# Patient Record
Sex: Female | Born: 1950 | Race: White | Hispanic: No | Smoking: Former smoker
Health system: Southern US, Community
[De-identification: ages and names within clinical notes are randomized; demographics above are authoritative.]

## PROBLEM LIST (undated history)

## (undated) DIAGNOSIS — C50919 Malignant neoplasm of unspecified site of unspecified female breast: Secondary | ICD-10-CM

## (undated) DIAGNOSIS — C4491 Basal cell carcinoma of skin, unspecified: Secondary | ICD-10-CM

## (undated) DIAGNOSIS — Z923 Personal history of irradiation: Secondary | ICD-10-CM

## (undated) DIAGNOSIS — Z9221 Personal history of antineoplastic chemotherapy: Secondary | ICD-10-CM

## (undated) DIAGNOSIS — C4492 Squamous cell carcinoma of skin, unspecified: Secondary | ICD-10-CM

## (undated) DIAGNOSIS — G709 Myoneural disorder, unspecified: Secondary | ICD-10-CM

## (undated) DIAGNOSIS — F419 Anxiety disorder, unspecified: Secondary | ICD-10-CM

## (undated) DIAGNOSIS — G4700 Insomnia, unspecified: Secondary | ICD-10-CM

## (undated) DIAGNOSIS — F32A Depression, unspecified: Secondary | ICD-10-CM

## (undated) DIAGNOSIS — F329 Major depressive disorder, single episode, unspecified: Secondary | ICD-10-CM

---

## 1984-01-06 HISTORY — PX: ABDOMINAL HYSTERECTOMY: SHX81

## 1997-01-05 DIAGNOSIS — Z9221 Personal history of antineoplastic chemotherapy: Secondary | ICD-10-CM

## 1997-01-05 DIAGNOSIS — Z923 Personal history of irradiation: Secondary | ICD-10-CM

## 1997-01-05 HISTORY — PX: BREAST LUMPECTOMY: SHX2

## 1997-01-05 HISTORY — DX: Personal history of irradiation: Z92.3

## 1997-01-05 HISTORY — DX: Personal history of antineoplastic chemotherapy: Z92.21

## 1997-02-05 DIAGNOSIS — C50919 Malignant neoplasm of unspecified site of unspecified female breast: Secondary | ICD-10-CM

## 1997-02-05 HISTORY — DX: Malignant neoplasm of unspecified site of unspecified female breast: C50.919

## 1997-04-05 ENCOUNTER — Encounter: Admission: RE | Admit: 1997-04-05 | Discharge: 1997-07-04 | Payer: Self-pay | Admitting: *Deleted

## 1997-06-22 ENCOUNTER — Ambulatory Visit (HOSPITAL_COMMUNITY): Admission: RE | Admit: 1997-06-22 | Discharge: 1997-06-22 | Payer: Self-pay | Admitting: *Deleted

## 1997-07-05 ENCOUNTER — Encounter: Admission: RE | Admit: 1997-07-05 | Discharge: 1997-10-03 | Payer: Self-pay | Admitting: *Deleted

## 1998-02-01 ENCOUNTER — Encounter: Payer: Self-pay | Admitting: Emergency Medicine

## 1998-02-01 ENCOUNTER — Emergency Department (HOSPITAL_COMMUNITY): Admission: EM | Admit: 1998-02-01 | Discharge: 1998-02-01 | Payer: Self-pay | Admitting: Emergency Medicine

## 1998-02-08 ENCOUNTER — Encounter: Admission: RE | Admit: 1998-02-08 | Discharge: 1998-03-04 | Payer: Self-pay | Admitting: Hematology & Oncology

## 1999-01-09 ENCOUNTER — Encounter: Payer: Self-pay | Admitting: Hematology & Oncology

## 1999-01-09 ENCOUNTER — Encounter: Admission: RE | Admit: 1999-01-09 | Discharge: 1999-01-09 | Payer: Self-pay | Admitting: Hematology & Oncology

## 1999-02-13 ENCOUNTER — Ambulatory Visit (HOSPITAL_COMMUNITY): Admission: RE | Admit: 1999-02-13 | Discharge: 1999-02-13 | Payer: Self-pay | Admitting: Hematology & Oncology

## 1999-02-13 ENCOUNTER — Encounter: Payer: Self-pay | Admitting: Hematology & Oncology

## 1999-02-24 ENCOUNTER — Encounter: Payer: Self-pay | Admitting: Hematology & Oncology

## 1999-02-24 ENCOUNTER — Encounter: Admission: RE | Admit: 1999-02-24 | Discharge: 1999-02-24 | Payer: Self-pay | Admitting: Hematology & Oncology

## 1999-04-25 ENCOUNTER — Encounter: Admission: RE | Admit: 1999-04-25 | Discharge: 1999-04-25 | Payer: Self-pay | Admitting: Hematology & Oncology

## 1999-04-25 ENCOUNTER — Encounter: Payer: Self-pay | Admitting: Hematology & Oncology

## 1999-09-30 ENCOUNTER — Encounter: Admission: RE | Admit: 1999-09-30 | Discharge: 1999-09-30 | Payer: Self-pay | Admitting: Obstetrics and Gynecology

## 1999-09-30 ENCOUNTER — Encounter: Payer: Self-pay | Admitting: Obstetrics and Gynecology

## 1999-10-10 ENCOUNTER — Ambulatory Visit (HOSPITAL_BASED_OUTPATIENT_CLINIC_OR_DEPARTMENT_OTHER): Admission: RE | Admit: 1999-10-10 | Discharge: 1999-10-10 | Payer: Self-pay | Admitting: Dentistry

## 2000-10-04 ENCOUNTER — Encounter: Payer: Self-pay | Admitting: Obstetrics and Gynecology

## 2000-10-04 ENCOUNTER — Encounter: Admission: RE | Admit: 2000-10-04 | Discharge: 2000-10-04 | Payer: Self-pay | Admitting: Obstetrics and Gynecology

## 2001-12-19 ENCOUNTER — Encounter: Admission: RE | Admit: 2001-12-19 | Discharge: 2001-12-19 | Payer: Self-pay | Admitting: Obstetrics and Gynecology

## 2001-12-19 ENCOUNTER — Encounter: Payer: Self-pay | Admitting: Obstetrics and Gynecology

## 2002-12-22 ENCOUNTER — Encounter: Admission: RE | Admit: 2002-12-22 | Discharge: 2002-12-22 | Payer: Self-pay | Admitting: Hematology & Oncology

## 2003-01-03 ENCOUNTER — Ambulatory Visit (HOSPITAL_COMMUNITY): Admission: RE | Admit: 2003-01-03 | Discharge: 2003-01-03 | Payer: Self-pay | Admitting: Obstetrics and Gynecology

## 2004-01-02 ENCOUNTER — Encounter: Admission: RE | Admit: 2004-01-02 | Discharge: 2004-01-02 | Payer: Self-pay | Admitting: Obstetrics and Gynecology

## 2004-07-30 ENCOUNTER — Ambulatory Visit: Payer: Self-pay | Admitting: Hematology & Oncology

## 2004-08-05 ENCOUNTER — Encounter: Admission: RE | Admit: 2004-08-05 | Discharge: 2004-08-05 | Payer: Self-pay | Admitting: Hematology & Oncology

## 2005-01-23 ENCOUNTER — Encounter: Admission: RE | Admit: 2005-01-23 | Discharge: 2005-01-23 | Payer: Self-pay | Admitting: Obstetrics and Gynecology

## 2005-02-20 ENCOUNTER — Ambulatory Visit: Payer: Self-pay | Admitting: Hematology & Oncology

## 2005-02-25 ENCOUNTER — Ambulatory Visit (HOSPITAL_COMMUNITY): Admission: RE | Admit: 2005-02-25 | Discharge: 2005-02-25 | Payer: Self-pay | Admitting: Hematology & Oncology

## 2006-02-17 ENCOUNTER — Ambulatory Visit: Payer: Self-pay | Admitting: Hematology & Oncology

## 2006-02-22 LAB — CBC WITH DIFFERENTIAL/PLATELET
BASO%: 0.8 % (ref 0.0–2.0)
Basophils Absolute: 0 10*3/uL (ref 0.0–0.1)
EOS%: 0.4 % (ref 0.0–7.0)
Eosinophils Absolute: 0 10*3/uL (ref 0.0–0.5)
HCT: 45.4 % (ref 34.8–46.6)
HGB: 15.8 g/dL (ref 11.6–15.9)
LYMPH%: 23.7 % (ref 14.0–48.0)
MCH: 32.2 pg (ref 26.0–34.0)
MCHC: 34.8 g/dL (ref 32.0–36.0)
MCV: 92.5 fL (ref 81.0–101.0)
MONO#: 0.4 10*3/uL (ref 0.1–0.9)
MONO%: 6.2 % (ref 0.0–13.0)
NEUT#: 4 10*3/uL (ref 1.5–6.5)
NEUT%: 68.9 % (ref 39.6–76.8)
Platelets: 218 10*3/uL (ref 145–400)
RBC: 4.91 10*6/uL (ref 3.70–5.32)
RDW: 13.1 % (ref 11.3–14.5)
WBC: 5.8 10*3/uL (ref 3.9–10.0)
lymph#: 1.4 10*3/uL (ref 0.9–3.3)

## 2006-02-22 LAB — COMPREHENSIVE METABOLIC PANEL
ALT: 15 U/L (ref 0–35)
AST: 20 U/L (ref 0–37)
Albumin: 4.3 g/dL (ref 3.5–5.2)
Alkaline Phosphatase: 54 U/L (ref 39–117)
BUN: 17 mg/dL (ref 6–23)
CO2: 28 mEq/L (ref 19–32)
Calcium: 9.5 mg/dL (ref 8.4–10.5)
Chloride: 107 mEq/L (ref 96–112)
Creatinine, Ser: 0.8 mg/dL (ref 0.40–1.20)
Glucose, Bld: 119 mg/dL — ABNORMAL HIGH (ref 70–99)
Potassium: 4.3 mEq/L (ref 3.5–5.3)
Sodium: 142 mEq/L (ref 135–145)
Total Bilirubin: 0.5 mg/dL (ref 0.3–1.2)
Total Protein: 6.5 g/dL (ref 6.0–8.3)

## 2006-07-22 ENCOUNTER — Encounter: Admission: RE | Admit: 2006-07-22 | Discharge: 2006-07-22 | Payer: Self-pay | Admitting: Hematology & Oncology

## 2006-10-08 ENCOUNTER — Emergency Department (HOSPITAL_COMMUNITY): Admission: EM | Admit: 2006-10-08 | Discharge: 2006-10-08 | Payer: Self-pay | Admitting: Emergency Medicine

## 2006-10-30 ENCOUNTER — Emergency Department (HOSPITAL_COMMUNITY): Admission: EM | Admit: 2006-10-30 | Discharge: 2006-10-30 | Payer: Self-pay | Admitting: Family Medicine

## 2007-02-17 ENCOUNTER — Ambulatory Visit: Payer: Self-pay | Admitting: Hematology & Oncology

## 2007-02-21 LAB — COMPREHENSIVE METABOLIC PANEL
ALT: 13 U/L (ref 0–35)
AST: 18 U/L (ref 0–37)
Albumin: 4.3 g/dL (ref 3.5–5.2)
Alkaline Phosphatase: 49 U/L (ref 39–117)
BUN: 19 mg/dL (ref 6–23)
CO2: 27 mEq/L (ref 19–32)
Calcium: 9.5 mg/dL (ref 8.4–10.5)
Chloride: 107 mEq/L (ref 96–112)
Creatinine, Ser: 0.76 mg/dL (ref 0.40–1.20)
Glucose, Bld: 88 mg/dL (ref 70–99)
Potassium: 4.5 mEq/L (ref 3.5–5.3)
Sodium: 143 mEq/L (ref 135–145)
Total Bilirubin: 0.4 mg/dL (ref 0.3–1.2)
Total Protein: 6.6 g/dL (ref 6.0–8.3)

## 2007-02-21 LAB — CBC WITH DIFFERENTIAL/PLATELET
BASO%: 0.6 % (ref 0.0–2.0)
Basophils Absolute: 0 10*3/uL (ref 0.0–0.1)
EOS%: 0.7 % (ref 0.0–7.0)
Eosinophils Absolute: 0 10*3/uL (ref 0.0–0.5)
HCT: 42.6 % (ref 34.8–46.6)
HGB: 14.9 g/dL (ref 11.6–15.9)
LYMPH%: 21.1 % (ref 14.0–48.0)
MCH: 32.2 pg (ref 26.0–34.0)
MCHC: 35 g/dL (ref 32.0–36.0)
MCV: 92 fL (ref 81.0–101.0)
MONO#: 0.3 10*3/uL (ref 0.1–0.9)
MONO%: 6.6 % (ref 0.0–13.0)
NEUT#: 3.7 10*3/uL (ref 1.5–6.5)
NEUT%: 71 % (ref 39.6–76.8)
Platelets: 242 10*3/uL (ref 145–400)
RBC: 4.63 10*6/uL (ref 3.70–5.32)
RDW: 13.4 % (ref 11.3–14.5)
WBC: 5.2 10*3/uL (ref 3.9–10.0)
lymph#: 1.1 10*3/uL (ref 0.9–3.3)

## 2007-08-17 ENCOUNTER — Encounter: Admission: RE | Admit: 2007-08-17 | Discharge: 2007-08-17 | Payer: Self-pay | Admitting: Obstetrics and Gynecology

## 2008-10-02 ENCOUNTER — Encounter: Admission: RE | Admit: 2008-10-02 | Discharge: 2008-10-02 | Payer: Self-pay | Admitting: Family Medicine

## 2008-10-11 ENCOUNTER — Encounter: Admission: RE | Admit: 2008-10-11 | Discharge: 2008-10-11 | Payer: Self-pay | Admitting: Family Medicine

## 2009-04-05 ENCOUNTER — Encounter: Admission: RE | Admit: 2009-04-05 | Discharge: 2009-04-05 | Payer: Self-pay | Admitting: Family Medicine

## 2009-11-15 ENCOUNTER — Encounter: Admission: RE | Admit: 2009-11-15 | Discharge: 2009-11-15 | Payer: Self-pay | Admitting: Family Medicine

## 2010-05-05 ENCOUNTER — Emergency Department (HOSPITAL_COMMUNITY)
Admission: EM | Admit: 2010-05-05 | Discharge: 2010-05-06 | Disposition: A | Discharge status: Home / Self Care | Payer: BC Managed Care – PPO | Attending: Emergency Medicine | Admitting: Emergency Medicine

## 2010-05-05 ENCOUNTER — Emergency Department (HOSPITAL_COMMUNITY): Payer: BC Managed Care – PPO

## 2010-05-05 DIAGNOSIS — M25569 Pain in unspecified knee: Secondary | ICD-10-CM | POA: Insufficient documentation

## 2010-05-05 DIAGNOSIS — M2390 Unspecified internal derangement of unspecified knee: Secondary | ICD-10-CM | POA: Insufficient documentation

## 2010-05-05 DIAGNOSIS — G589 Mononeuropathy, unspecified: Secondary | ICD-10-CM | POA: Insufficient documentation

## 2010-05-05 DIAGNOSIS — Z853 Personal history of malignant neoplasm of breast: Secondary | ICD-10-CM | POA: Insufficient documentation

## 2010-05-23 NOTE — Op Note (Signed)
[place]  Patient:    Candace Howe, Candace Howe                     MRN: [id number] Adm. Date:  [id number] Attending:  Juluis Pitch Hincken                           Operative Report  DESCRIPTION OF PROCEDURE:  The patient was brought to the operating room and was placed in the supine position and underwent nasotracheal intubation.  The following local anesthetic was administered; 216 mg Carbocaine with 0.54 mg of Neocobefrin and 36 mg Xylocaine with 0.036 mg epinephrine and 18 mg Marcaine with 0.018 mg epinephrine.  Teeth #4 and 12 were removed with forceps. Sulcular incisions made on the facial and lingual from 10 mm distal to tooth #5 to 10 mm distal to tooth #11.  After removal of granulation tissue, all roots were instrumented with Cavitron and hand instruments.  Heavy subgingival calculus present.  1 to 2 mm osteoplasty was done and all roots were treated with Tetracycline.  Flaps were trimmed and sutured with eight 4-0 chromic gut sutures.  In the mandibular arch, tooth #18 was deemed hopeless due to facial recession and bone loss and was removed along with teeth #20, 21, 28, and 29 with forceps.  The root of tooth #20 was fractured and was removed in conjunction with the flap procedure.  Sulcular incisions were made on the facial and lingual from the distal of tooth #18 to the distal of tooth #22. After removal of granulation tissue, all roots were instrumented with Cavitron and hand instrument.  Moderate bone loss and 1 to 2 mm osteoplasty done.  All roots treated with Tetracycline.  Flaps were trimmed and sutured with four 4-0 chromic gut sutures.  Teeth #27 and 30 were carefully root planed.  Teeth #8, 19, and 30 will need crowns due to decay and fracture line.  No overall complications were encountered and postoperative instruments oral and written were given to the patient and her husband.  She was prescribed Perigaurd rinse and was given a  bottle of this.  Postoperative care will be done in Dr. Elton Sin office. DD:  10/10/99 TD:  10/11/99 Job: [id number]

## 2010-11-11 ENCOUNTER — Other Ambulatory Visit: Payer: Self-pay | Admitting: Family Medicine

## 2010-11-11 DIAGNOSIS — Z1231 Encounter for screening mammogram for malignant neoplasm of breast: Secondary | ICD-10-CM

## 2010-12-04 ENCOUNTER — Ambulatory Visit
Admission: RE | Admit: 2010-12-04 | Discharge: 2010-12-04 | Disposition: A | Discharge status: Home / Self Care | Payer: BC Managed Care – PPO | Source: Ambulatory Visit | Attending: Family Medicine | Admitting: Family Medicine

## 2010-12-04 DIAGNOSIS — Z1231 Encounter for screening mammogram for malignant neoplasm of breast: Secondary | ICD-10-CM

## 2012-02-11 ENCOUNTER — Other Ambulatory Visit: Payer: Self-pay | Admitting: Family Medicine

## 2012-02-11 DIAGNOSIS — Z1231 Encounter for screening mammogram for malignant neoplasm of breast: Secondary | ICD-10-CM

## 2012-02-11 DIAGNOSIS — Z780 Asymptomatic menopausal state: Secondary | ICD-10-CM

## 2012-03-10 ENCOUNTER — Ambulatory Visit
Admission: RE | Admit: 2012-03-10 | Discharge: 2012-03-10 | Disposition: A | Discharge status: Home / Self Care | Payer: BC Managed Care – PPO | Source: Ambulatory Visit | Attending: Family Medicine | Admitting: Family Medicine

## 2012-03-10 DIAGNOSIS — Z1231 Encounter for screening mammogram for malignant neoplasm of breast: Secondary | ICD-10-CM

## 2012-03-14 ENCOUNTER — Other Ambulatory Visit: Payer: Self-pay | Admitting: Family Medicine

## 2012-03-24 ENCOUNTER — Ambulatory Visit
Admission: RE | Admit: 2012-03-24 | Discharge: 2012-03-24 | Disposition: A | Discharge status: Home / Self Care | Payer: BC Managed Care – PPO | Source: Ambulatory Visit | Attending: Family Medicine | Admitting: Family Medicine

## 2012-04-05 ENCOUNTER — Encounter (INDEPENDENT_AMBULATORY_CARE_PROVIDER_SITE_OTHER): Payer: Self-pay | Admitting: General Surgery

## 2012-04-05 ENCOUNTER — Ambulatory Visit (INDEPENDENT_AMBULATORY_CARE_PROVIDER_SITE_OTHER): Payer: BC Managed Care – PPO | Admitting: General Surgery

## 2012-04-05 ENCOUNTER — Encounter (HOSPITAL_COMMUNITY): Payer: Self-pay | Admitting: Pharmacy Technician

## 2012-04-05 VITALS — BP 130/74 | HR 76 | Temp 97.0°F | Resp 18 | Ht 63.0 in | Wt 117.0 lb

## 2012-04-05 DIAGNOSIS — R928 Other abnormal and inconclusive findings on diagnostic imaging of breast: Secondary | ICD-10-CM

## 2012-04-05 DIAGNOSIS — Z853 Personal history of malignant neoplasm of breast: Secondary | ICD-10-CM | POA: Insufficient documentation

## 2012-04-05 DIAGNOSIS — R921 Mammographic calcification found on diagnostic imaging of breast: Secondary | ICD-10-CM

## 2012-04-05 NOTE — Progress Notes (Signed)
Patient ID: Candace Howe, female   DOB: 10/29/1950, 62 y.o.   MRN: [id number]  Chief Complaint  Patient presents with  . New Evaluation    Rt Breast    HPI Candace Howe is a 62 y.o. female.  Referred by Dr Dan Boyle HPI This is a 62-year-old otherwise healthy female who has a history of a left breast cancer treated in 1999 for what appears to be a lumpectomy and axillary lymph node dissection followed by chemotherapy, radiation therapy, and 5 years of tamoxifen. 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 She has otherwise done well since then. She's had no history of abnormal mammograms or any other issues. She has no complaints referable to her right breast now or really her left breast either except for the pre-existing neuropathic pain. She underwent a screening mammogram this year that showed some new microcalcifications in the mid to lower right breast outer portion. She underwent stereotactic core biopsy that was unsuccessful due to the fact there were no calcifications present. Her pathology showed fibrocystic changes. She's referred for evaluation for excisional biopsy. History reviewed. No pertinent past medical history.  Past Surgical History  Procedure Laterality Date  . Abdominal hysterectomy  1986  . Breast lumpectomy  1999    Left Breast    Family History  Problem Relation Age of Onset  . Cancer Father     lung Ca at age 70 yrs    Social History History  Substance Use Topics  . Smoking status: Not on file  . Smokeless tobacco: Not on file  . Alcohol Use: Not on file    Allergies  Allergen Reactions  . Augmentin (Amoxicillin-Pot Clavulanate) Nausea And Vomiting  . Sulfur Rash    Current Outpatient Prescriptions  Medication Sig Dispense Refill  . amitriptyline (ELAVIL) 50 MG tablet       . citalopram (CELEXA) 40 MG tablet       . gabapentin (NEURONTIN) 400 MG capsule       . HYDROcodone-acetaminophen (NORCO/VICODIN)  5-325 MG per tablet       . Vitamin D, Ergocalciferol, (DRISDOL) 50000 UNITS CAPS Take 50,000 Units by mouth.      . LORazepam (ATIVAN) 1 MG tablet        No current facility-administered medications for this visit.    Review of Systems Review of Systems  Constitutional: Negative for fever, chills and unexpected weight change.  HENT: Negative for hearing loss, congestion, sore throat, trouble swallowing and voice change.   Eyes: Negative for visual disturbance.  Respiratory: Negative for cough and wheezing.   Cardiovascular: Negative for chest pain, palpitations and leg swelling.  Gastrointestinal: Negative for nausea, vomiting, abdominal pain, diarrhea, constipation, blood in stool, abdominal distention and anal bleeding.  Genitourinary: Negative for hematuria, vaginal bleeding and difficulty urinating.  Musculoskeletal: Negative for arthralgias.  Skin: Negative for rash and wound.  Neurological: Negative for seizures, syncope and headaches.  Hematological: Negative for adenopathy. Does not bruise/bleed easily.  Psychiatric/Behavioral: Negative for confusion.    Blood pressure 130/74, pulse 76, temperature 97 F (36.1 C), resp. rate 18, height 5' 3" (1.6 m), weight 117 lb (53.071 kg).  Physical Exam Physical Exam  Vitals reviewed. Constitutional: She appears well-developed and well-nourished.  Cardiovascular: Normal rate, regular rhythm and normal heart sounds.   Pulmonary/Chest: Effort normal. She has no wheezes. Right breast exhibits no inverted nipple, no mass, no nipple discharge, no skin change and no tenderness.   Left breast exhibits no inverted nipple, no mass, no nipple discharge, no skin change and no tenderness. Breasts are symmetrical.    Lymphadenopathy:    She has no cervical adenopathy.    Data Reviewed **ADDENDUM** CREATED: 03/25/2012 12:42:10  The patient was called by myself Dr. Boyle. The patient states she  is doing well after her right breast biopsy with  some soreness and  bruising at the biopsy site. The patient was given the results of  her biopsy which was compatible with fibrocystic change, although  there were no microcalcifications within the specimen. Therefore  the patient requires needle localization for excisional biopsy of  these right breast microcalcifications. Patient's immediate  questions and concerns were addressed. The patient was given a  surgical referral to Dr. Zakkary Thibault 04/05/2012 at 09:10 a.m.  **END ADDENDUM** SIGNED BY: Daniel P. Boyle, M.D.   Assessment    Right breast calcifications History breast cancer      Plan    Right breast wire localization biopsy  We discussed indication to rule out cancer with new calcs esp with history. We discussed wire loc biopsy with risks of bleeding/hematoma, infection and possible further surgery if cancer identified.  Will schedule asap.        Candace Howe 04/05/2012, 9:31 AM    

## 2012-04-07 ENCOUNTER — Encounter (HOSPITAL_COMMUNITY)
Admission: RE | Admit: 2012-04-07 | Discharge: 2012-04-07 | Disposition: A | Discharge status: Home / Self Care | Payer: BC Managed Care – PPO | Source: Ambulatory Visit | Attending: General Surgery | Admitting: General Surgery

## 2012-04-07 ENCOUNTER — Encounter (HOSPITAL_COMMUNITY): Payer: Self-pay

## 2012-04-07 DIAGNOSIS — Z01812 Encounter for preprocedural laboratory examination: Secondary | ICD-10-CM | POA: Insufficient documentation

## 2012-04-07 HISTORY — DX: Major depressive disorder, single episode, unspecified: F32.9

## 2012-04-07 HISTORY — DX: Depression, unspecified: F32.A

## 2012-04-07 HISTORY — DX: Myoneural disorder, unspecified: G70.9

## 2012-04-07 LAB — BASIC METABOLIC PANEL
BUN: 11 mg/dL (ref 6–23)
CO2: 30 mEq/L (ref 19–32)
Chloride: 99 mEq/L (ref 96–112)
GFR calc non Af Amer: 76 mL/min — ABNORMAL LOW (ref >90)
Glucose, Bld: 78 mg/dL (ref 70–99)
Potassium: 4.4 mEq/L (ref 3.5–5.1)
Sodium: 135 mEq/L (ref 135–145)

## 2012-04-07 LAB — CBC WITH DIFFERENTIAL/PLATELET
Eosinophils Absolute: 0 10*3/uL (ref 0.0–0.7)
HCT: 43.4 % (ref 36.0–46.0)
Hemoglobin: 15.3 g/dL — ABNORMAL HIGH (ref 12.0–15.0)
Lymphs Abs: 2.5 10*3/uL (ref 0.7–4.0)
MCH: 31.5 pg (ref 26.0–34.0)
Monocytes Absolute: 0.6 10*3/uL (ref 0.1–1.0)
Monocytes Relative: 8 % (ref 3–12)
Neutro Abs: 3.8 10*3/uL (ref 1.7–7.7)
Neutrophils Relative %: 55 % (ref 43–77)
RBC: 4.86 MIL/uL (ref 3.87–5.11)

## 2012-04-07 NOTE — Pre-Procedure Instructions (Signed)
[facility]  04/07/2012   Your procedure is scheduled on:  04-12-2012  Report to [facility] to [facility] when finished at [facility].Take [place] Elevators to the 3rd floor. .  Call this number if you have problems the morning of surgery: [phone]   Remember:   Do not eat food or drink liquids after midnight.   Take these medicines the morning of surgery with A SIP OF WATER: Celexa,pain medication as needed,lorazepam as needed.   Do not wear jewelry, make-up or nail polish.  Do not wear lotions, powders, or perfumes. You may wear deodorant.  Do not shave 48 hours prior to surgery.   Do not bring valuables to the hospital.  Contacts, dentures or bridgework may not be worn into surgery.       Patients discharged the day of surgery will not be allowed to drive home.   Name and phone number of your driver: _______________________   Special Instructions: Shower using CHG 2 nights before surgery and the night before surgery.  If you shower the day of surgery use CHG.  Use special wash - you have one bottle of CHG for all showers.  You should use approximately 1/3 of the bottle for each shower.   Please read over the following fact sheets that you were given: Pain Booklet and Surgical Site Infection Prevention

## 2012-04-11 MED ORDER — CIPROFLOXACIN IN D5W 400 MG/200ML IV SOLN
400.0000 mg | INTRAVENOUS | Status: DC
  Filled 2012-04-11: qty 200

## 2012-04-12 ENCOUNTER — Other Ambulatory Visit (INDEPENDENT_AMBULATORY_CARE_PROVIDER_SITE_OTHER): Payer: Self-pay | Admitting: General Surgery

## 2012-04-12 ENCOUNTER — Encounter (HOSPITAL_COMMUNITY): Payer: Self-pay | Admitting: Certified Registered"

## 2012-04-12 ENCOUNTER — Ambulatory Visit
Admission: RE | Admit: 2012-04-12 | Discharge: 2012-04-12 | Disposition: A | Discharge status: Home / Self Care | Payer: BC Managed Care – PPO | Source: Ambulatory Visit | Attending: General Surgery | Admitting: General Surgery

## 2012-04-12 ENCOUNTER — Ambulatory Visit (HOSPITAL_COMMUNITY): Admission: RE | Admit: 2012-04-12 | Payer: BC Managed Care – PPO | Source: Ambulatory Visit | Admitting: General Surgery

## 2012-04-12 ENCOUNTER — Encounter (HOSPITAL_COMMUNITY): Admission: RE | Payer: Self-pay | Source: Ambulatory Visit

## 2012-04-12 DIAGNOSIS — R921 Mammographic calcification found on diagnostic imaging of breast: Secondary | ICD-10-CM

## 2012-04-12 SURGERY — BREAST BIOPSY WITH NEEDLE LOCALIZATION
Anesthesia: General | Site: Breast | Laterality: Right

## 2012-04-12 MED ORDER — BUPIVACAINE HCL (PF) 0.25 % IJ SOLN
INTRAMUSCULAR | Status: AC
  Filled 2012-04-12: qty 30

## 2012-04-12 NOTE — Preoperative (Signed)
Beta Blockers   Reason not to administer Beta Blockers:Not Applicable 

## 2012-04-14 ENCOUNTER — Telehealth (INDEPENDENT_AMBULATORY_CARE_PROVIDER_SITE_OTHER): Payer: Self-pay

## 2012-04-14 NOTE — Telephone Encounter (Signed)
Called pt to ask her about getting rescheduled for surgery since her bx results come back today. The pt wants to go ahead and get scheduled for surgery ASAP. The pt did ask for me to ask Dr Dwain Sarna about the lymph nodes in the axilla b/c the pt does not want her axilla touched at all. I sent Dr Dwain Sarna a epic message to ask about orders and the axilla. Will find out the answer and call pt back.

## 2012-04-20 ENCOUNTER — Encounter (HOSPITAL_COMMUNITY): Payer: Self-pay

## 2012-04-20 ENCOUNTER — Telehealth (INDEPENDENT_AMBULATORY_CARE_PROVIDER_SITE_OTHER): Payer: Self-pay

## 2012-04-20 NOTE — Telephone Encounter (Signed)
LMOM for pt to call me back.

## 2012-04-23 NOTE — Pre-Procedure Instructions (Signed)
Candace Howe  04/23/2012   Your procedure is scheduled on:  05-02-2012  Monday  Report to [facility] directly from the [facility]  Call this number if you have problems the morning of surgery: [phone]   Remember:   Do not eat food or drink liquids after midnight.    Take these medicines the morning of surgery with A SIP OF WATER: celexa,pain medication as needed,lorazepam as needed   Do not wear jewelry, make-up or nail polish.  Do not wear lotions, powders, or perfumes.   Do not shave 48 hours prior to surgery.   Do not bring valuables to the hospital.  Contacts, dentures or bridgework may not be worn into surgery.       Patients discharged the day of surgery will not be allowed to drive home.   Name and phone number of your driver: _________________   Special Instructions: Shower using CHG 2 nights before surgery and the night before surgery.  If you shower the day of surgery use CHG.  Use special wash - you have one bottle of CHG for all showers.  You should use approximately 1/3 of the bottle for each shower.    Please read over the following fact sheets that you were given: Pain Booklet, Coughing and Deep Breathing and Surgical Site Infection Prevention

## 2012-04-25 ENCOUNTER — Encounter (HOSPITAL_COMMUNITY): Payer: Self-pay

## 2012-04-25 ENCOUNTER — Encounter (INDEPENDENT_AMBULATORY_CARE_PROVIDER_SITE_OTHER): Payer: Self-pay | Admitting: General Surgery

## 2012-04-25 ENCOUNTER — Other Ambulatory Visit (INDEPENDENT_AMBULATORY_CARE_PROVIDER_SITE_OTHER): Payer: Self-pay | Admitting: General Surgery

## 2012-04-25 ENCOUNTER — Encounter (HOSPITAL_COMMUNITY)
Admission: RE | Admit: 2012-04-25 | Discharge: 2012-04-25 | Disposition: A | Discharge status: Home / Self Care | Payer: BC Managed Care – PPO | Source: Ambulatory Visit | Attending: General Surgery | Admitting: General Surgery

## 2012-04-25 DIAGNOSIS — R928 Other abnormal and inconclusive findings on diagnostic imaging of breast: Secondary | ICD-10-CM

## 2012-04-25 HISTORY — DX: Anxiety disorder, unspecified: F41.9

## 2012-04-25 HISTORY — DX: Insomnia, unspecified: G47.00

## 2012-04-25 LAB — CBC
Hemoglobin: 15.5 g/dL — ABNORMAL HIGH (ref 12.0–15.0)
MCH: 31.5 pg (ref 26.0–34.0)
MCHC: 35.1 g/dL (ref 30.0–36.0)
MCV: 89.6 fL (ref 78.0–100.0)
RBC: 4.92 MIL/uL (ref 3.87–5.11)

## 2012-04-25 NOTE — Progress Notes (Signed)
Pt doesn't have a cardiologist  Denies stress test/echo/heart cath  Medical Md is Dr.Maura Hamrick  Denies EKG or CXR in past yr

## 2012-05-01 MED ORDER — VANCOMYCIN HCL IN DEXTROSE 1-5 GM/200ML-% IV SOLN: 1000.0000 mg | INTRAVENOUS | Status: AC

## 2012-05-02 ENCOUNTER — Encounter (HOSPITAL_COMMUNITY): Payer: Self-pay | Admitting: Certified Registered"

## 2012-05-02 ENCOUNTER — Ambulatory Visit (HOSPITAL_COMMUNITY)
Admission: RE | Admit: 2012-05-02 | Discharge: 2012-05-02 | Disposition: A | Discharge status: Home / Self Care | Payer: BC Managed Care – PPO | Source: Ambulatory Visit | Attending: General Surgery | Admitting: General Surgery

## 2012-05-02 ENCOUNTER — Encounter (HOSPITAL_COMMUNITY)
Admission: RE | Disposition: A | Discharge status: Home / Self Care | Payer: Self-pay | Source: Ambulatory Visit | Attending: General Surgery

## 2012-05-02 ENCOUNTER — Ambulatory Visit
Admission: RE | Admit: 2012-05-02 | Discharge: 2012-05-02 | Disposition: A | Discharge status: Home / Self Care | Payer: BC Managed Care – PPO | Source: Ambulatory Visit | Attending: General Surgery | Admitting: General Surgery

## 2012-05-02 ENCOUNTER — Ambulatory Visit (HOSPITAL_COMMUNITY): Payer: BC Managed Care – PPO | Admitting: Certified Registered"

## 2012-05-02 ENCOUNTER — Encounter (HOSPITAL_COMMUNITY): Payer: Self-pay | Admitting: *Deleted

## 2012-05-02 DIAGNOSIS — Z853 Personal history of malignant neoplasm of breast: Secondary | ICD-10-CM | POA: Insufficient documentation

## 2012-05-02 DIAGNOSIS — Z79899 Other long term (current) drug therapy: Secondary | ICD-10-CM | POA: Insufficient documentation

## 2012-05-02 DIAGNOSIS — Z923 Personal history of irradiation: Secondary | ICD-10-CM | POA: Insufficient documentation

## 2012-05-02 DIAGNOSIS — Z882 Allergy status to sulfonamides status: Secondary | ICD-10-CM | POA: Insufficient documentation

## 2012-05-02 DIAGNOSIS — F329 Major depressive disorder, single episode, unspecified: Secondary | ICD-10-CM | POA: Insufficient documentation

## 2012-05-02 DIAGNOSIS — N6089 Other benign mammary dysplasias of unspecified breast: Secondary | ICD-10-CM

## 2012-05-02 DIAGNOSIS — N6019 Diffuse cystic mastopathy of unspecified breast: Secondary | ICD-10-CM | POA: Insufficient documentation

## 2012-05-02 DIAGNOSIS — F411 Generalized anxiety disorder: Secondary | ICD-10-CM | POA: Insufficient documentation

## 2012-05-02 DIAGNOSIS — F3289 Other specified depressive episodes: Secondary | ICD-10-CM | POA: Insufficient documentation

## 2012-05-02 DIAGNOSIS — R928 Other abnormal and inconclusive findings on diagnostic imaging of breast: Secondary | ICD-10-CM

## 2012-05-02 DIAGNOSIS — Z880 Allergy status to penicillin: Secondary | ICD-10-CM | POA: Insufficient documentation

## 2012-05-02 DIAGNOSIS — Z9221 Personal history of antineoplastic chemotherapy: Secondary | ICD-10-CM | POA: Insufficient documentation

## 2012-05-02 DIAGNOSIS — G589 Mononeuropathy, unspecified: Secondary | ICD-10-CM | POA: Insufficient documentation

## 2012-05-02 HISTORY — PX: BREAST BIOPSY: SHX20

## 2012-05-02 LAB — BASIC METABOLIC PANEL
BUN: 20 mg/dL (ref 6–23)
Calcium: 9.5 mg/dL (ref 8.4–10.5)
Creatinine, Ser: 0.77 mg/dL (ref 0.50–1.10)
GFR calc non Af Amer: 89 mL/min — ABNORMAL LOW (ref >90)
Glucose, Bld: 83 mg/dL (ref 70–99)
Potassium: 4.3 mEq/L (ref 3.5–5.1)

## 2012-05-02 LAB — CBC WITH DIFFERENTIAL/PLATELET
Basophils Absolute: 0 10*3/uL (ref 0.0–0.1)
Basophils Relative: 1 % (ref 0–1)
Eosinophils Absolute: 0 10*3/uL (ref 0.0–0.7)
Hemoglobin: 14.8 g/dL (ref 12.0–15.0)
MCH: 30.6 pg (ref 26.0–34.0)
MCHC: 34.9 g/dL (ref 30.0–36.0)
Monocytes Relative: 9 % (ref 3–12)
Neutrophils Relative %: 58 % (ref 43–77)
RDW: 13.5 % (ref 11.5–15.5)

## 2012-05-02 SURGERY — BREAST BIOPSY WITH NEEDLE LOCALIZATION
Anesthesia: General | Site: Breast | Laterality: Right | Wound class: Clean

## 2012-05-02 MED ORDER — ACETAMINOPHEN 10 MG/ML IV SOLN: 1000.0000 mg | Freq: Once | INTRAVENOUS | Status: DC | PRN

## 2012-05-02 MED ORDER — HYDROMORPHONE HCL PF 1 MG/ML IJ SOLN
INTRAMUSCULAR | Status: AC
  Filled 2012-05-02: qty 1

## 2012-05-02 MED ORDER — HYDROMORPHONE HCL PF 1 MG/ML IJ SOLN
0.2500 mg | INTRAMUSCULAR | Status: DC | PRN
  Administered 2012-05-02 (×2): 0.5 mg via INTRAVENOUS

## 2012-05-02 MED ORDER — BUPIVACAINE-EPINEPHRINE PF 0.25-1:200000 % IJ SOLN
INTRAMUSCULAR | Status: AC
  Filled 2012-05-02: qty 30

## 2012-05-02 MED ORDER — FENTANYL CITRATE 0.05 MG/ML IJ SOLN
INTRAMUSCULAR | Status: DC | PRN
  Administered 2012-05-02: 150 ug via INTRAVENOUS

## 2012-05-02 MED ORDER — LACTATED RINGERS IV SOLN
INTRAVENOUS | Status: DC | PRN
  Administered 2012-05-02: 10:00:00 via INTRAVENOUS

## 2012-05-02 MED ORDER — MIDAZOLAM HCL 5 MG/5ML IJ SOLN
INTRAMUSCULAR | Status: DC | PRN
  Administered 2012-05-02: 2 mg via INTRAVENOUS

## 2012-05-02 MED ORDER — PROPOFOL 10 MG/ML IV BOLUS
INTRAVENOUS | Status: DC | PRN
  Administered 2012-05-02: 170 mg via INTRAVENOUS

## 2012-05-02 MED ORDER — CEFAZOLIN SODIUM-DEXTROSE 2-3 GM-% IV SOLR
INTRAVENOUS | Status: AC
  Administered 2012-05-02: 2 g via INTRAVENOUS
  Filled 2012-05-02: qty 50

## 2012-05-02 MED ORDER — LIDOCAINE HCL (CARDIAC) 20 MG/ML IV SOLN
INTRAVENOUS | Status: DC | PRN
  Administered 2012-05-02: 40 mg via INTRAVENOUS

## 2012-05-02 MED ORDER — ONDANSETRON HCL 4 MG/2ML IJ SOLN: 4.0000 mg | Freq: Once | INTRAMUSCULAR | Status: DC | PRN

## 2012-05-02 MED ORDER — HYDROCODONE-ACETAMINOPHEN 5-325 MG PO TABS: 1.0000 | ORAL_TABLET | Freq: Four times a day (QID) | ORAL | Status: DC | PRN

## 2012-05-02 MED ORDER — BUPIVACAINE-EPINEPHRINE 0.25% -1:200000 IJ SOLN
INTRAMUSCULAR | Status: DC | PRN
  Administered 2012-05-02: 16 mL

## 2012-05-02 MED ORDER — 0.9 % SODIUM CHLORIDE (POUR BTL) OPTIME
TOPICAL | Status: DC | PRN
  Administered 2012-05-02: 1000 mL

## 2012-05-02 MED ORDER — ONDANSETRON HCL 4 MG/2ML IJ SOLN
INTRAMUSCULAR | Status: DC | PRN
  Administered 2012-05-02: 4 mg via INTRAVENOUS

## 2012-05-02 SURGICAL SUPPLY — 44 items
APPLIER CLIP 9.375 MED OPEN (MISCELLANEOUS)
BENZOIN TINCTURE PRP APPL 2/3 (GAUZE/BANDAGES/DRESSINGS) ×2 IMPLANT
BINDER BREAST LRG (GAUZE/BANDAGES/DRESSINGS) IMPLANT
BINDER BREAST XLRG (GAUZE/BANDAGES/DRESSINGS) IMPLANT
CANISTER SUCTION 2500CC (MISCELLANEOUS) IMPLANT
CHLORAPREP W/TINT 26ML (MISCELLANEOUS) ×2 IMPLANT
CLIP APPLIE 9.375 MED OPEN (MISCELLANEOUS) IMPLANT
CLOTH BEACON ORANGE TIMEOUT ST (SAFETY) ×2 IMPLANT
COVER SURGICAL LIGHT HANDLE (MISCELLANEOUS) ×2 IMPLANT
DECANTER SPIKE VIAL GLASS SM (MISCELLANEOUS) ×2 IMPLANT
DERMABOND ADVANCED (GAUZE/BANDAGES/DRESSINGS) ×1
DERMABOND ADVANCED .7 DNX12 (GAUZE/BANDAGES/DRESSINGS) ×1 IMPLANT
DEVICE DUBIN SPECIMEN MAMMOGRA (MISCELLANEOUS) ×2 IMPLANT
DRAPE CHEST BREAST 15X10 FENES (DRAPES) ×2 IMPLANT
DRSG TEGADERM 4X4.75 (GAUZE/BANDAGES/DRESSINGS) ×2 IMPLANT
ELECT CAUTERY BLADE 6.4 (BLADE) ×2 IMPLANT
ELECT REM PT RETURN 9FT ADLT (ELECTROSURGICAL) ×2
ELECTRODE REM PT RTRN 9FT ADLT (ELECTROSURGICAL) ×1 IMPLANT
GLOVE BIO SURGEON STRL SZ7 (GLOVE) ×2 IMPLANT
GLOVE BIO SURGEON STRL SZ7.5 (GLOVE) ×2 IMPLANT
GLOVE BIOGEL PI IND STRL 7.0 (GLOVE) ×2 IMPLANT
GLOVE BIOGEL PI IND STRL 7.5 (GLOVE) ×4 IMPLANT
GLOVE BIOGEL PI INDICATOR 7.0 (GLOVE) ×2
GLOVE BIOGEL PI INDICATOR 7.5 (GLOVE) ×4
GLOVE SURG SS PI 7.0 STRL IVOR (GLOVE) ×2 IMPLANT
GOWN STRL NON-REIN LRG LVL3 (GOWN DISPOSABLE) ×6 IMPLANT
KIT BASIN OR (CUSTOM PROCEDURE TRAY) ×2 IMPLANT
KIT MARKER MARGIN INK (KITS) IMPLANT
KIT ROOM TURNOVER OR (KITS) ×2 IMPLANT
NEEDLE HYPO 25GX1X1/2 BEV (NEEDLE) ×2 IMPLANT
NS IRRIG 1000ML POUR BTL (IV SOLUTION) ×2 IMPLANT
PACK GENERAL/GYN (CUSTOM PROCEDURE TRAY) ×2 IMPLANT
PAD ARMBOARD 7.5X6 YLW CONV (MISCELLANEOUS) ×2 IMPLANT
SPONGE GAUZE 4X4 12PLY (GAUZE/BANDAGES/DRESSINGS) ×2 IMPLANT
STAPLER VISISTAT 35W (STAPLE) ×2 IMPLANT
STRIP CLOSURE SKIN 1/2X4 (GAUZE/BANDAGES/DRESSINGS) ×2 IMPLANT
SUT MNCRL AB 4-0 PS2 18 (SUTURE) ×2 IMPLANT
SUT SILK 2 0 SH (SUTURE) ×2 IMPLANT
SUT VIC AB 2-0 SH 27 (SUTURE) ×2
SUT VIC AB 2-0 SH 27XBRD (SUTURE) ×1 IMPLANT
SUT VIC AB 3-0 SH 18 (SUTURE) ×2 IMPLANT
SYR CONTROL 10ML LL (SYRINGE) ×2 IMPLANT
TOWEL OR 17X24 6PK STRL BLUE (TOWEL DISPOSABLE) ×2 IMPLANT
TOWEL OR 17X26 10 PK STRL BLUE (TOWEL DISPOSABLE) ×2 IMPLANT

## 2012-05-02 NOTE — Op Note (Signed)
Preoperative diagnosis history of left breast cancer in 1999, right breast complex sclerosing lesion with abnormal mammogram Postoperative diagnosis: Same as above Procedure: Right breast wire-guided excisional biopsy Surgeon: Dr. Harden Mo Anesthesia: Gen. With LMA Estimated blood loss: Minimal Complications: None Drains: None Pathology: Right breast tissue marked with short stitch superior, long stitch lateral, double stitch deep Sponge and needle count correct at of operation Disposition to recovery in stable condition  Indications: This is a 62 year old female with history of left breast cancer treated in 1999. She underwent screening mammography this year and was found to have a right breast lesion. 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 She was then scheduled for excisional biopsy with wire guidance.  Procedure: The patient first had a wire placed by Dr. Deboraha Sprang at the breast center. I had these mammograms in the operating room. After informed consent was obtained she was then taken to the operating room. She was administered 2 g of cefazolin. Sequential compression devices were placed. She then underwent general anesthesia with LMA. A surgical timeout was then performed.  I made a radial incision overlying the lesion as well as the wire tract. I used the cautery to excise the wire and the surrounding tissue. I then marked this as above. A mammogram was taken confirming removal of the lesion, clip, and wire. This was confirmed by radiology. I then obtained hemostasis. I closed the breast tissue at the 2-0 Vicryl. I closed the dermis with a 3-0 Vicryl and the skin with a 4-0 Monocryl. I infiltrated Marcaine throughout this region. I then placed Steri-Strips and a sterile dressing. She tolerated this well was extubated and transferred to recovery stable.

## 2012-05-02 NOTE — Interval H&P Note (Signed)
History and Physical Interval Note:  05/02/2012 9:29 AM  Candace Howe  has presented today for surgery, with the diagnosis of right breast calcifications  The various methods of treatment have been discussed with the patient and family. After consideration of risks, benefits and other options for treatment, the patient has consented to  Procedure(s): RIGHT BREAST WIRE  LOCALIZATION EXCISION (Right) as a surgical intervention .  The patient's history has been reviewed, patient examined, no change in status, stable for surgery.  I have reviewed the patient's chart and labs.  Questions were answered to the patient's satisfaction.     Allie Gerhold

## 2012-05-02 NOTE — Transfer of Care (Signed)
Immediate Anesthesia Transfer of Care Note  Patient: Candace Howe  Procedure(s) Performed: Procedure(s): RIGHT BREAST WIRE GUIDED BIOPSY (Right)  Patient Location: PACU  Anesthesia Type:General  Level of Consciousness: awake, alert  and oriented  Airway & Oxygen Therapy: Patient Spontanous Breathing and Patient connected to face mask oxygen  Post-op Assessment: Report given to PACU RN and Post -op Vital signs reviewed and stable  Post vital signs: Reviewed  Complications: No apparent anesthesia complications

## 2012-05-02 NOTE — Anesthesia Preprocedure Evaluation (Addendum)
Anesthesia Evaluation  Patient identified by MRN, date of birth, ID band Patient awake    Reviewed: Allergy & Precautions, H&P , NPO status , Patient's Chart, lab work & pertinent test results  History of Anesthesia Complications Negative for: history of anesthetic complications  Airway Mallampati: I TM Distance: >3 FB Neck ROM: Full    Dental  (+) Teeth Intact and Dental Advisory Given   Pulmonary  breath sounds clear to auscultation        Cardiovascular Rhythm:Regular Rate:Normal     Neuro/Psych PSYCHIATRIC DISORDERS Anxiety Depression  Neuromuscular disease (per patient "right sided nerve damage from previous breast surgery, resulting in constant right arm pain")    GI/Hepatic   Endo/Other    Renal/GU      Musculoskeletal   Abdominal   Peds  Hematology   Anesthesia Other Findings   Reproductive/Obstetrics                          Anesthesia Physical Anesthesia Plan  ASA: II  Anesthesia Plan: General   Post-op Pain Management:    Induction: Intravenous  Airway Management Planned: LMA  Additional Equipment:   Intra-op Plan:   Post-operative Plan: Extubation in OR  Informed Consent: I have reviewed the patients History and Physical, chart, labs and discussed the procedure including the risks, benefits and alternatives for the proposed anesthesia with the patient or authorized representative who has indicated his/her understanding and acceptance.   Dental advisory given  Plan Discussed with: Anesthesiologist, Surgeon and CRNA  Anesthesia Plan Comments: (R. Breast mass Anxiety H/O neuropathy following R. Axillary dissection 1999  Plan GA with LMA  Kipp Brood, MD)       Anesthesia Quick Evaluation

## 2012-05-02 NOTE — Addendum Note (Signed)
Addendum created 05/02/12 1403 by Sheppard Evens, CRNA   Modules edited: Anesthesia Medication Administration

## 2012-05-02 NOTE — H&P (View-Only) (Signed)
Patient ID: Candace Howe, female   DOB: [birth year], 62 y.o.   MRN: [id number]  Chief Complaint  Patient presents with  . New Evaluation    Rt Breast    HPI [name] is a 62 y.o. female.  Referred by Dr Lindi Adie HPI This is a 62 year old otherwise healthy female who has a history of a left breast cancer treated in 1999 for what appears to be a lumpectomy and axillary lymph node dissection followed by chemotherapy, radiation therapy, and 5 years of tamoxifen. 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 She has otherwise done well since then. She's had no history of abnormal mammograms or any other issues. She has no complaints referable to her right breast now or really her left breast either except for the pre-existing neuropathic pain. She underwent a screening mammogram this year that showed some new microcalcifications in the mid to lower right breast outer portion. She underwent stereotactic core biopsy that was unsuccessful due to the fact there were no calcifications present. Her pathology showed fibrocystic changes. She's referred for evaluation for excisional biopsy. History reviewed. No pertinent past medical history.  Past Surgical History  Procedure Laterality Date  . Abdominal hysterectomy  1986  . Breast lumpectomy  1999    Left Breast    Family History  Problem Relation Age of Onset  . Cancer Father     lung Ca at age 47 yrs    Social History History  Substance Use Topics  . Smoking status: Not on file  . Smokeless tobacco: Not on file  . Alcohol Use: Not on file    Allergies  Allergen Reactions  . Augmentin (Amoxicillin-Pot Clavulanate) Nausea And Vomiting  . Sulfur Rash    Current Outpatient Prescriptions  Medication Sig Dispense Refill  . amitriptyline (ELAVIL) 50 MG tablet       . citalopram (CELEXA) 40 MG tablet       . gabapentin (NEURONTIN) 400 MG capsule       . HYDROcodone-acetaminophen (NORCO/VICODIN)  5-325 MG per tablet       . Vitamin D, Ergocalciferol, (DRISDOL) 50000 UNITS CAPS Take 50,000 Units by mouth.      Candace Howe LORazepam (ATIVAN) 1 MG tablet        No current facility-administered medications for this visit.    Review of Systems Review of Systems  Constitutional: Negative for fever, chills and unexpected weight change.  HENT: Negative for hearing loss, congestion, sore throat, trouble swallowing and voice change.   Eyes: Negative for visual disturbance.  Respiratory: Negative for cough and wheezing.   Cardiovascular: Negative for chest pain, palpitations and leg swelling.  Gastrointestinal: Negative for nausea, vomiting, abdominal pain, diarrhea, constipation, blood in stool, abdominal distention and anal bleeding.  Genitourinary: Negative for hematuria, vaginal bleeding and difficulty urinating.  Musculoskeletal: Negative for arthralgias.  Skin: Negative for rash and wound.  Neurological: Negative for seizures, syncope and headaches.  Hematological: Negative for adenopathy. Does not bruise/bleed easily.  Psychiatric/Behavioral: Negative for confusion.    Blood pressure 130/74, pulse 76, temperature 97 F (36.1 C), resp. rate 18, height 5\' 3"  (1.6 m), weight 117 lb (53.071 kg).  Physical Exam Physical Exam  Vitals reviewed. Constitutional: She appears well-developed and well-nourished.  Cardiovascular: Normal rate, regular rhythm and normal heart sounds.   Pulmonary/Chest: Effort normal. She has no wheezes. Right breast exhibits no inverted nipple, no mass, no nipple discharge, no skin change and no tenderness.  Left breast exhibits no inverted nipple, no mass, no nipple discharge, no skin change and no tenderness. Breasts are symmetrical.    Lymphadenopathy:    She has no cervical adenopathy.    Data Reviewed **ADDENDUM** CREATED: 03/25/2012 12:42:10  The patient was called by myself Dr. Micheline Maze. The patient states she  is doing well after her right breast biopsy with  some soreness and  bruising at the biopsy site. The patient was given the results of  her biopsy which was compatible with fibrocystic change, although  there were no microcalcifications within the specimen. Therefore  the patient requires needle localization for excisional biopsy of  these right breast microcalcifications. Patient's immediate  questions and concerns were addressed. The patient was given a  surgical referral to Dr. Dwain Sarna 04/05/2012 at 09:10 a.m.  **END ADDENDUM** SIGNED BY: Melton Alar. Micheline Maze, M.D.   Assessment    Right breast calcifications History breast cancer      Plan    Right breast wire localization biopsy  We discussed indication to rule out cancer with new calcs esp with history. We discussed wire loc biopsy with risks of bleeding/hematoma, infection and possible further surgery if cancer identified.  Will schedule asap.        [name] 04/05/2012, 9:31 AM

## 2012-05-02 NOTE — Anesthesia Postprocedure Evaluation (Signed)
  Anesthesia Post-op Note  Patient: Candace Howe  Procedure(s) Performed: Procedure(s): RIGHT BREAST WIRE GUIDED BIOPSY (Right)  Patient Location: PACU  Anesthesia Type:General  Level of Consciousness: awake  Airway and Oxygen Therapy: Patient Spontanous Breathing  Post-op Pain: mild  Post-op Assessment: Post-op Vital signs reviewed  Post-op Vital Signs: stable  Complications: No apparent anesthesia complications

## 2012-05-02 NOTE — Anesthesia Procedure Notes (Signed)
Procedure Name: LMA Insertion Date/Time: 05/02/2012 9:47 AM Performed by: Arlice Colt B Pre-anesthesia Checklist: Patient identified, Emergency Drugs available, Suction available, Patient being monitored and Timeout performed Patient Re-evaluated:Patient Re-evaluated prior to inductionOxygen Delivery Method: Circle system utilized Preoxygenation: Pre-oxygenation with 100% oxygen Intubation Type: IV induction LMA: LMA inserted LMA Size: 4.0 Number of attempts: 1 Placement Confirmation: positive ETCO2 and breath sounds checked- equal and bilateral Tube secured with: Tape Dental Injury: Teeth and Oropharynx as per pre-operative assessment

## 2012-05-03 ENCOUNTER — Encounter (HOSPITAL_COMMUNITY): Payer: Self-pay | Admitting: General Surgery

## 2012-05-03 ENCOUNTER — Encounter (INDEPENDENT_AMBULATORY_CARE_PROVIDER_SITE_OTHER): Payer: BC Managed Care – PPO | Admitting: General Surgery

## 2012-05-04 ENCOUNTER — Telehealth (INDEPENDENT_AMBULATORY_CARE_PROVIDER_SITE_OTHER): Payer: Self-pay

## 2012-05-04 NOTE — Telephone Encounter (Signed)
Called pt to notify her that the path report shows atypical ductal hyperplasia with no malignancy per Dr Dwain Sarna.

## 2012-05-12 ENCOUNTER — Other Ambulatory Visit: Payer: Self-pay | Admitting: Dermatology

## 2012-05-16 ENCOUNTER — Ambulatory Visit (INDEPENDENT_AMBULATORY_CARE_PROVIDER_SITE_OTHER): Payer: BC Managed Care – PPO | Admitting: General Surgery

## 2012-05-16 ENCOUNTER — Telehealth: Payer: Self-pay | Admitting: *Deleted

## 2012-05-16 ENCOUNTER — Encounter (INDEPENDENT_AMBULATORY_CARE_PROVIDER_SITE_OTHER): Payer: Self-pay | Admitting: General Surgery

## 2012-05-16 VITALS — BP 108/74 | HR 94 | Temp 96.7°F | Ht 63.0 in | Wt 115.8 lb

## 2012-05-16 DIAGNOSIS — N6099 Unspecified benign mammary dysplasia of unspecified breast: Secondary | ICD-10-CM

## 2012-05-16 DIAGNOSIS — Z853 Personal history of malignant neoplasm of breast: Secondary | ICD-10-CM

## 2012-05-16 DIAGNOSIS — N6089 Other benign mammary dysplasias of unspecified breast: Secondary | ICD-10-CM

## 2012-05-16 NOTE — Progress Notes (Signed)
Subjective:     Patient ID: Candace Howe, female   DOB: 05-Apr-1950, 62 y.o.   MRN: [id number]  HPI This is a 62 year old otherwise healthy female who has a history of a left breast cancer treated in 1999 for what appears to be a lumpectomy and axillary lymph node dissection followed by chemotherapy, radiation therapy, and 5 years of tamoxifen.  She underwent a screening mammogram this year that showed some new microcalcifications in the mid to lower right breast outer portion. She underwent stereotactic core biopsy that was unsuccessful due to the fact there were no calcifications present. Her pathology showed fibrocystic changes. I then performed a right breast wire-guided excisional biopsy. She's done very well from that and reports no real complaints at all. Her pathology showed atypical ductal hyperplasia with fibrocystic changes. She comes in today to discuss these results.   Review of Systems     Objective:   Physical Exam  Pulmonary/Chest:         Assessment:     History left breast cancer Right breast atypical ductal hyperplasia     Plan:     We discussed her pathology results in detail today. I do think it would be reasonable for her to be seen by medical oncology. We discussed that should be and she would like to go to [facility]. I will set her up to see Dr. Welton Flakes. I think due to her history as well as atypical ductal hyperplasia that consideration of possible antiestrogen and screening with combined mammography an MRI would be reasonable. Also her up to see Dr. Welton Flakes to go over a number of these issues.

## 2012-05-16 NOTE — Telephone Encounter (Signed)
Left message for pt to return my call so I can schedule an appt for her to see Dr. Welton Flakes.

## 2012-05-20 ENCOUNTER — Telehealth: Payer: Self-pay | Admitting: *Deleted

## 2012-05-20 NOTE — Telephone Encounter (Signed)
Confirmed 05/24/12 appt w/ pt.  Unable to mail before appt letter & packet - gave verbal and put in notes for [facility] to give packet at check in.  Emailed Dr. Dwain Sarna to make him aware.  Took paperwork to Med [facility] for chart.

## 2012-05-24 ENCOUNTER — Ambulatory Visit (HOSPITAL_BASED_OUTPATIENT_CLINIC_OR_DEPARTMENT_OTHER): Payer: BC Managed Care – PPO | Admitting: Oncology

## 2012-05-24 ENCOUNTER — Other Ambulatory Visit (HOSPITAL_BASED_OUTPATIENT_CLINIC_OR_DEPARTMENT_OTHER): Payer: BC Managed Care – PPO | Admitting: Lab

## 2012-05-24 ENCOUNTER — Other Ambulatory Visit: Payer: Self-pay | Admitting: *Deleted

## 2012-05-24 ENCOUNTER — Encounter: Payer: Self-pay | Admitting: Oncology

## 2012-05-24 ENCOUNTER — Ambulatory Visit: Payer: BC Managed Care – PPO

## 2012-05-24 VITALS — BP 99/63 | HR 79 | Temp 98.2°F | Resp 20 | Ht 63.0 in | Wt 116.6 lb

## 2012-05-24 DIAGNOSIS — Z853 Personal history of malignant neoplasm of breast: Secondary | ICD-10-CM

## 2012-05-24 DIAGNOSIS — Z85828 Personal history of other malignant neoplasm of skin: Secondary | ICD-10-CM

## 2012-05-24 DIAGNOSIS — N6089 Other benign mammary dysplasias of unspecified breast: Secondary | ICD-10-CM

## 2012-05-24 LAB — CBC WITH DIFFERENTIAL/PLATELET
BASO%: 1 % (ref 0.0–2.0)
EOS%: 0.4 % (ref 0.0–7.0)
HCT: 42.3 % (ref 34.8–46.6)
MCH: 30.9 pg (ref 25.1–34.0)
MCHC: 34 g/dL (ref 31.5–36.0)
NEUT%: 57 % (ref 38.4–76.8)
RBC: 4.65 10*6/uL (ref 3.70–5.45)
RDW: 13.6 % (ref 11.2–14.5)
WBC: 6.3 10*3/uL (ref 3.9–10.3)
lymph#: 2.2 10*3/uL (ref 0.9–3.3)

## 2012-05-24 LAB — COMPREHENSIVE METABOLIC PANEL (CC13)
ALT: 9 U/L (ref 0–55)
AST: 17 U/L (ref 5–34)
Calcium: 9.2 mg/dL (ref 8.4–10.4)
Chloride: 102 mEq/L (ref 98–107)
Creatinine: 0.8 mg/dL (ref 0.6–1.1)
Sodium: 139 mEq/L (ref 136–145)
Total Protein: 6.7 g/dL (ref 6.4–8.3)

## 2012-05-24 MED ORDER — RALOXIFENE HCL 60 MG PO TABS: 60.0000 mg | ORAL_TABLET | Freq: Every day | ORAL | Status: DC

## 2012-05-24 NOTE — Progress Notes (Signed)
Candace Howe [id number] 07-Apr-1950 62 y.o. 05/24/2012 1:18 PM  CC  Ailene Ravel, MD Dr. Sharman Crate Hamrick [address] [place] [address] Dr. Emelia Loron  REASON FOR CONSULTATION:  62 year old female with prior history of left breast cancer 1999. Recently mammogram showed calcifications and lumpectomy revealed atypical ductal hyperplasia. Patient is seen for discussion of future breast cancer risk reduction.   STAGE:  Atypical ductal hyperplasia (right breast)  History of left breast cancer 1999 status post lumpectomy axillary lymph node dissection followed by chemoradiation therapy and 5 years of tamoxifen.  (stage unknown)  REFERRING PHYSICIAN: Dr. Emelia Loron  HISTORY OF PRESENT ILLNESS:  Candace Howe is a 62 y.o. female.  With medical history significant for basal cell carcinomas. She also has a history of left breast cancer originally treated in 1999 by lumpectomy done by Dr. Samuella Cota who has since retired. Her chemotherapy was given by Dr. Jeannine Boga, radiation by Dr. Jackelyn Knife. She then received 5 years of tamoxifen. 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 Patient recently had a screening mammogram performed that revealed new microcalcifications in the mid to lower right breast outer portion.she underwent stereotactic core biopsy that only showed fibrocystic changes.  She therefore was seen by Dr. Emelia Loron and patient underwent an breast biopsy with needle localization on 05/02/2012.the pathology showed atypical ductal hyperplasia with fibrocystic changes.she is now referred to medical oncology for discussion of future breast cancer risk reduction and possibly antiestrogen therapy and screening recommendations. She is without any complaints. She came to the office by herself she was not accompanied by anyone else  Past Medical History: Past Medical History  Diagnosis Date  . Neuromuscular disorder     nerve damage under left  arm  . Cancer     breast cancer  . Depression     takes Celexa daily  . Anxiety     takes Ativan prn  . Insomnia     takes Elavil nightly    Past Surgical History: Past Surgical History  Procedure Laterality Date  . Abdominal hysterectomy  1986  . Breast lumpectomy  1999    Left Breast  . Breast biopsy Right 05/02/2012    Procedure: RIGHT BREAST WIRE GUIDED BIOPSY;  Surgeon: Emelia Loron, MD;  Location: [facility] OR;  Service: General;  Laterality: Right;    Family History: Family History  Problem Relation Age of Onset  . Cancer Father     lung Ca at age 18 yrs    Social History History  Substance Use Topics  . Smoking status: Former Smoker    Quit date: 01/05/1997  . Smokeless tobacco: Not on file  . Alcohol Use: No    Allergies: Allergies  Allergen Reactions  . Augmentin (Amoxicillin-Pot Clavulanate) Nausea And Vomiting  . Sulfa Antibiotics Rash    Current Medications: Current Outpatient Prescriptions  Medication Sig Dispense Refill  . amitriptyline (ELAVIL) 50 MG tablet Take 50 mg by mouth at bedtime.       . citalopram (CELEXA) 40 MG tablet Take 40 mg by mouth daily.       Marland Kitchen gabapentin (NEURONTIN) 400 MG capsule Take 800 mg by mouth 4 (four) times daily.      Marland Kitchen HYDROcodone-acetaminophen (NORCO/VICODIN) 5-325 MG per tablet Take 1-2 tablets by mouth every 6 (six) hours as needed for pain.  20 tablet  1  . LORazepam (ATIVAN) 1 MG tablet Take 1 mg by mouth daily as needed for anxiety.       Marland Kitchen  vitamin D, CHOLECALCIFEROL, 400 UNITS tablet Take 400 Units by mouth 2 (two) times daily.        No current facility-administered medications for this visit.    OB/GYN History:menarche at 14, menopause at early 37's surgical , no HRT, first live birth at 42, 3 live births  Fertility Discussion: n/a Prior History of Cancer: left breast cancer s/p lumpectomy/radaition/chemotherapy/ 5 years tamoxifen in 1999  Health Maintenance:  Colonoscopy yes Bone Density bone  density Last PAP smear 3 years ago  ECOG PERFORMANCE STATUS: 0 - Asymptomatic  Genetic Counseling/testing: no family hx of breat cancer, colon, stomach cancer, uterine cancers, patient with personal hx of breast in 40's  REVIEW OF SYSTEMS:  Patient does have a history of neuropathic type of pains from her prior surgery. Has had multiple skin lesions removed for basal cell carcinoma. She denies any nausea vomiting fevers chills night sweats headaches shortness of breath chest pains palpitations. Patient is very concerned about weight gain if she has to go on tamoxifen. She would prefer not to go on tamoxifen. She has no hematuria hematochezia melena hemoptysis or hematemesis. Remainder of the 14 point review of systems is negative.  PHYSICAL EXAMINATION: Blood pressure 99/63, pulse 79, temperature 98.2 F (36.8 C), temperature source Oral, resp. rate 20, height 5\' 3"  (1.6 m), weight 116 lb 9.6 oz (52.889 kg).  Well-developed nourished female in no acute distress HEENT exam EOMI PERRLA sclerae anicteric no conjunctival pallor oral mucosa is moist neck supple no palpable adenopathy lungs clear cardiovascular regular rate rhythm abdomen soft nontender no HSM Extremities no edema neuro is nonfocal Right breast healing excisional scar there is some tenderness no great news Left breast no masses nipple discharge.   STUDIES/RESULTS: Mm Rt Plc Breast Loc Dev   1st Lesion  Inc Mammo Guide  05/02/2012   *RADIOLOGY REPORT*  Clinical Data:  Atypical ductal hyperplasia in the outer portion of the right breast diagnosed by stereotactic core needle biopsy  RIGHT BREAST NEEDLE LOCALIZATION WITH MAMMOGRAPHIC GUIDANCE AND SPECIMEN RADIOGRAPH  Patient presents for needle localization prior to surgical excision.  The patient and I discussed the procedure of needle localization including benefits and alternatives. We discussed the high likelihood of a successful procedure. We discussed the risks of the procedure,  including infection, bleeding, tissue injury, and further surgery. Written informed consent was given.  Using mammographic guidance, sterile technique, 2% lidocaine, and a 7 cm modified Kopans needle, the clip marking the biopsy site in the outer portion of the right breast was localized using a lateromedial approach.  Films were labeled and sent with the patient to surgery.  She tolerated the procedure well.  Specimen radiograph was performed at [facility] operating room and confirms the clip and wire to be present in the tissue sample.  The specimen is marked for pathology.  Results were discussed with the nurse in the operating room.  IMPRESSION: Needle localization right breast.  No apparent complications.   Original Report Authenticated By: Cain Saupe, M.D.     LABS:    Chemistry      Component Value Date/Time   NA 139 05/24/2012 1223   NA 137 05/02/2012 0900   K 4.3 05/24/2012 1223   K 4.3 05/02/2012 0900   CL 102 05/24/2012 1223   CL 101 05/02/2012 0900   CO2 30* 05/24/2012 1223   CO2 30 05/02/2012 0900   BUN 12.0 05/24/2012 1223   BUN 20 05/02/2012 0900   CREATININE 0.8 05/24/2012 1223  CREATININE 0.77 05/02/2012 0900      Component Value Date/Time   CALCIUM 9.2 05/24/2012 1223   CALCIUM 9.5 05/02/2012 0900   ALKPHOS 66 05/24/2012 1223   ALKPHOS 49 02/21/2007 0839   AST 17 05/24/2012 1223   AST 18 02/21/2007 0839   ALT 9 05/24/2012 1223   ALT 13 02/21/2007 0839   BILITOT 0.35 05/24/2012 1223   BILITOT 0.4 02/21/2007 0839      Lab Results  Component Value Date   WBC 6.3 05/24/2012   HGB 14.4 05/24/2012   HCT 42.3 05/24/2012   MCV 91.0 05/24/2012   PLT 253 05/24/2012   PATHOLOGY: Diagnosis Breast, lumpectomy, Right - ATYPICAL DUCTAL HYPERPLASIA, SEE COMMENT. - FIBROCYSTIC CHANGES. Microscopic Comment The margins are inked and histologically examined. Of note, the area of atypical ductal hyperplasia is greater than 0.2 cm from the nearest margin. Dr. Colonel Bald has seen slide 1B  in consultation with agreement of the findings as they pertain to the above diagnosis. (RAH:caf 05/03/12) Zandra Abts MD Pathologist, Electronic Signature (Case signed 05/03/2012) S ASSESSMENT    62 year old female with  #1 history of left-sided breast cancer requiring lumpectomy and axillary lymph node dissection followed by adjuvant chemotherapy radiation and 5 years of [facility]x [facility].  #2 new diagnosis of atypical ductal hyperplasia of the right breast status post lumpectomy and. No evidence of malignancy was noted. Postoperatively she is doing well. She is seen for discussion of risk reduction for future breast cancers.  #3 patient and I discussed her pathology today and the significance of this. We also in discussed her prior history of cancers. I do think that the patient should be seen by genetic counselor and I have referred her to 1. She also should be on an antiestrogen therapy for future breast risk reduction. She did not want to be on tamoxifen. I recommended use of Evista for prevention. I explained to her the risks and benefits complications and side effects and benefits. She was agreeable to start this.  #4 we discussed screening tools that are available. I calculated her lifetime risk of developing future breast cancer at 34%. I do think she would qualify by NCCN guidelines to get MRIs I have ordered 1. She should also get yearly mammograms.  Clinical Trial Eligibility:no Multidisciplinary conference discussionknown     PLAN:    #1 proceed with a cyst 60 mg daily prescription was sent to her pharmacy.  #2 MRI as a screening tool on a yearly basis.  #3 we discussed lifestyle modification to prevent future cancers.  #4we also discussed not using tanning bed since patient is a tanned her and does have history of basal cell carcinomas.  #5 I have referred her to genetic counseling        Discussion: Patient is being treated per NCCN  breast cancer care guidelines appropriate for stage.n/a   Thank you so much for allowing me to participate in the care of Deshunda C Paino. I will continue to follow up the patient with you and assist in her care.  All questions were answered. The patient knows to call the clinic with any problems, questions or concerns. We can certainly see the patient much sooner if necessary.  I spent 45 minutes counseling the patient face to face. The total time spent in the appointment was 60 minutes.  Drue Second, MD Medical/Oncology [facility] [phone] (beeper) [phone] (Office)  05/24/2012, 1:18 PM

## 2012-05-24 NOTE — Patient Instructions (Addendum)
#1 we discussed your pathology including the present 1 and previous history of breast cancer back in 1999.  #2 we discussed what is atypical ductal hyperplasia. We discussed your risk of developing breast cancer in the future. Your lifetime risk is around 34%.  #3 we discussed prevention strategies including lifestyle modification, screening with MRI and mammograms, we also discussed prevention with medications including use of Evista. You have taken tamoxifen in the past and you have declined tamoxifen due to the side effects.  #4 I have given a prescription for Evista which you will take 60 mg on a daily basis. We discussed side effects rationale and benefits of this.  #5 I have ordered a MRI of the breasts for you.  #6 I will see you back in 3 months time for followup   Raloxifene tablets What is this medicine? RALOXIFENE (ral OX i feen) reduces the amount of calcium lost from bones. It is used to treat and prevent osteoporosis in women who have experienced menopause. This medicine may be used for other purposes; ask your health care provider or pharmacist if you have questions. What should I tell my health care provider before I take this medicine? They need to know if you have any of these conditions: -a history of blood clots -cancer -heart failure -liver disease -premenopausal -an unusual or allergic reaction to raloxifene, other medicines, foods, dyes, or preservatives -pregnant or trying to get pregnant -breast-feeding How should I use this medicine? Take this medicine by mouth with a glass of water. Follow the directions on the prescription label. The tablets can be taken with or without food. Take your doses at regular intervals. Do not take your medicine more often than directed. Talk to your pediatrician regarding the use of this medicine in children. Special care may be needed. Overdosage: If you think you have taken too much of this medicine contact a poison control  center or emergency room at once. NOTE: This medicine is only for you. Do not share this medicine with others. What if I miss a dose? If you miss a dose, take it as soon as you can. If it is almost time for your next dose, take only that dose. Do not take double or extra doses. What may interact with this medicine? -ampicillin -cholestyramine -colestipol -diazepam -diazoxide -female hormones like hormone replacement therapy -lidocaine -warfarin This list may not describe all possible interactions. Give your health care provider a list of all the medicines, herbs, non-prescription drugs, or dietary supplements you use. Also tell them if you smoke, drink alcohol, or use illegal drugs. Some items may interact with your medicine. What should I watch for while using this medicine? Visit your doctor or health care professional for regular checks on your progress. Do not stop taking this medicine except on the advice of your doctor or health care professional. You should make sure you get enough calcium and vitamin D in your diet while you are taking this medicine. Discuss your dietary needs with your health care professional or nutritionist. Exercise may help to prevent bone loss. Discuss your exercise needs with your doctor or health care professional. This medicine can rarely cause blood clots. You should avoid long periods of bed rest while taking this medicine. If you are going to have surgery, tell your doctor or health care professional that you are taking this medicine. This medicine should be stopped at least 3 days before surgery. After surgery, it should be restarted only after you are walking  again. It should not be restarted while you still need long periods of bed rest. You should not smoke while taking this medicine. Smoking may also increase your risk of blood clots. Smoking can also decrease the effects of this medicine. This medicine does not prevent hot flashes. It may cause hot  flashes in some patients at the start of therapy. What side effects may I notice from receiving this medicine? Side effects that you should report to your doctor or health care professional as soon as possible: -change in vision -chest pain -difficulty breathing -leg pain or swelling -skin rash, itching Side effects that usually do not require medical attention (report to your doctor or health care professional if they continue or are bothersome): -fluid build-up -leg cramps -stomach pain -sweating This list may not describe all possible side effects. Call your doctor for medical advice about side effects. You may report side effects to FDA at 1-800-FDA-1088. Where should I keep my medicine? Keep out of the reach of children. Store at room temperature between 15 and 30 degrees C (59 and 86 degrees F). Throw away any unused medicine after the expiration date. NOTE: This sheet is a summary. It may not cover all possible information. If you have questions about this medicine, talk to your doctor, pharmacist, or health care provider.  2013, [employer]vier/Gold Standard. (12/08/2007 3:15:14 PM)

## 2012-05-25 ENCOUNTER — Telehealth: Payer: Self-pay | Admitting: Oncology

## 2012-05-25 NOTE — Telephone Encounter (Signed)
, °

## 2012-06-02 ENCOUNTER — Ambulatory Visit
Admission: RE | Admit: 2012-06-02 | Discharge: 2012-06-02 | Disposition: A | Discharge status: Home / Self Care | Payer: BC Managed Care – PPO | Source: Ambulatory Visit | Attending: Oncology | Admitting: Oncology

## 2012-06-02 ENCOUNTER — Other Ambulatory Visit: Payer: BC Managed Care – PPO

## 2012-06-02 DIAGNOSIS — Z853 Personal history of malignant neoplasm of breast: Secondary | ICD-10-CM

## 2012-06-02 MED ORDER — GADOBENATE DIMEGLUMINE 529 MG/ML IV SOLN
9.0000 mL | Freq: Once | INTRAVENOUS | Status: AC | PRN
  Administered 2012-06-02: 9 mL via INTRAVENOUS

## 2012-06-06 ENCOUNTER — Other Ambulatory Visit: Payer: Self-pay | Admitting: Adult Health

## 2012-06-06 ENCOUNTER — Telehealth: Payer: Self-pay | Admitting: Medical Oncology

## 2012-06-06 DIAGNOSIS — Z853 Personal history of malignant neoplasm of breast: Secondary | ICD-10-CM

## 2012-06-06 NOTE — Telephone Encounter (Signed)
Message copied by Rexene Edison on Mon Jun 06, 2012  4:34 PM ------      Message from: Augustin Schooling C      Created: Mon Jun 06, 2012  8:50 AM       Please call patient.  MRI results shows bilateral pulmonary nodules that need to be further evaluated with a CT of the Chest with contrast.  Please ask if she has tolerated contrast in the past.  Please stress that we have no idea what these nodules are until we eval with CT chest.             Thanks,       L      ----- Message -----         From: Rad Results In Interface         Sent: 06/02/2012   4:43 PM           To: Victorino December, MD                   ------

## 2012-06-06 NOTE — Telephone Encounter (Signed)
Per NP, called patient to inform her of MRI results showing bilateral pulmonary nodules and a CT scan of Chest is needed for further evaluation. Inquired with pt whether she has had problems with contrast dye in the past, patient states she has not. Stressed to pt that per NP, we have no idea what these nodules are until an evaluation with CT chest. Patient states she is concerned. Stressed to patient that is understandable, with a CT chest scan will have a better idea of what the nodules could be. Patient expressed understanding. Sched to call pt for appt.  Mssg sent to NP/MD

## 2012-06-10 ENCOUNTER — Encounter (HOSPITAL_COMMUNITY): Payer: Self-pay

## 2012-06-10 ENCOUNTER — Ambulatory Visit (HOSPITAL_COMMUNITY)
Admission: RE | Admit: 2012-06-10 | Discharge: 2012-06-10 | Disposition: A | Discharge status: Home / Self Care | Payer: BC Managed Care – PPO | Source: Ambulatory Visit | Attending: Adult Health | Admitting: Adult Health

## 2012-06-10 DIAGNOSIS — I2510 Atherosclerotic heart disease of native coronary artery without angina pectoris: Secondary | ICD-10-CM | POA: Insufficient documentation

## 2012-06-10 DIAGNOSIS — Z923 Personal history of irradiation: Secondary | ICD-10-CM | POA: Insufficient documentation

## 2012-06-10 DIAGNOSIS — Z853 Personal history of malignant neoplasm of breast: Secondary | ICD-10-CM

## 2012-06-10 DIAGNOSIS — I700 Atherosclerosis of aorta: Secondary | ICD-10-CM | POA: Insufficient documentation

## 2012-06-10 DIAGNOSIS — R918 Other nonspecific abnormal finding of lung field: Secondary | ICD-10-CM | POA: Insufficient documentation

## 2012-06-10 DIAGNOSIS — Z9221 Personal history of antineoplastic chemotherapy: Secondary | ICD-10-CM | POA: Insufficient documentation

## 2012-06-10 MED ORDER — IOHEXOL 300 MG/ML  SOLN
80.0000 mL | Freq: Once | INTRAMUSCULAR | Status: AC | PRN
  Administered 2012-06-10: 80 mL via INTRAVENOUS

## 2012-07-04 ENCOUNTER — Other Ambulatory Visit: Payer: BC Managed Care – PPO | Admitting: Lab

## 2012-07-04 ENCOUNTER — Encounter: Payer: BC Managed Care – PPO | Admitting: Genetic Counselor

## 2012-07-16 ENCOUNTER — Encounter: Payer: Self-pay | Admitting: *Deleted

## 2012-07-16 NOTE — Progress Notes (Signed)
Mailed after appt letter to pt. 

## 2012-08-18 ENCOUNTER — Telehealth: Payer: Self-pay | Admitting: Oncology

## 2012-08-22 ENCOUNTER — Ambulatory Visit: Payer: BC Managed Care – PPO | Admitting: Oncology

## 2012-08-22 ENCOUNTER — Other Ambulatory Visit: Payer: BC Managed Care – PPO | Admitting: Lab

## 2012-09-06 ENCOUNTER — Ambulatory Visit (HOSPITAL_BASED_OUTPATIENT_CLINIC_OR_DEPARTMENT_OTHER): Payer: BC Managed Care – PPO | Admitting: Oncology

## 2012-09-06 ENCOUNTER — Other Ambulatory Visit (HOSPITAL_BASED_OUTPATIENT_CLINIC_OR_DEPARTMENT_OTHER): Payer: BC Managed Care – PPO

## 2012-09-06 ENCOUNTER — Encounter: Payer: Self-pay | Admitting: Oncology

## 2012-09-06 VITALS — BP 120/76 | HR 66 | Temp 98.8°F | Resp 20 | Ht 63.0 in | Wt 121.9 lb

## 2012-09-06 DIAGNOSIS — N6089 Other benign mammary dysplasias of unspecified breast: Secondary | ICD-10-CM

## 2012-09-06 DIAGNOSIS — Z853 Personal history of malignant neoplasm of breast: Secondary | ICD-10-CM

## 2012-09-06 LAB — COMPREHENSIVE METABOLIC PANEL (CC13)
ALT: 10 U/L (ref 0–55)
AST: 17 U/L (ref 5–34)
Creatinine: 0.7 mg/dL (ref 0.6–1.1)
Total Bilirubin: 0.37 mg/dL (ref 0.20–1.20)

## 2012-09-06 LAB — CBC WITH DIFFERENTIAL/PLATELET
BASO%: 1.1 % (ref 0.0–2.0)
EOS%: 0.5 % (ref 0.0–7.0)
HCT: 40.2 % (ref 34.8–46.6)
LYMPH%: 38 % (ref 14.0–49.7)
MCH: 31 pg (ref 25.1–34.0)
MCHC: 33.5 g/dL (ref 31.5–36.0)
NEUT%: 52.7 % (ref 38.4–76.8)
Platelets: 218 10*3/uL (ref 145–400)
RBC: 4.34 10*6/uL (ref 3.70–5.45)
lymph#: 1.9 10*3/uL (ref 0.9–3.3)

## 2012-09-06 NOTE — Progress Notes (Signed)
OFFICE PROGRESS NOTE  CC**  Candace Ravel, MD Dr. Sharman Crate Hamrick [address] [place] [address] Dr. Emelia Loron  DIAGNOSIS: 62 year old female with prior history of left breast cancer 1999. Recently mammogram showed calcifications and lumpectomy revealed atypical ductal hyperplasia  STAGE:  Atypical ductal hyperplasia (right breast)  History of left breast cancer 1999 status post lumpectomy axillary lymph node dissection followed by chemoradiation therapy and 5 years of tamoxifen. (stage unknown)  PRIOR THERAPY:  #1 She also has a history of left breast cancer originally treated in 1999 by lumpectomy done by Dr. Samuella Cota who has since retired. Her chemotherapy was given by Dr. Jeannine Boga, radiation by Dr. Jackelyn Knife. She then received 5 years of tamoxifen. 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 Patient recently had a screening mammogram performed that revealed new microcalcifications in the mid to lower right breast outer portion.she underwent stereotactic core biopsy that only showed fibrocystic changes  #2 She therefore was seen by Dr. Emelia Loron and patient underwent an breast biopsy with needle localization on 05/02/2012.the pathology showed atypical ductal hyperplasia with fibrocystic changes.she is now referred to medical oncology for discussion of future breast cancer risk reduction and possibly antiestrogen therapy and screening recommendations  #3 Began evista 60 mg daily on 05/24/12    CURRENT THERAPY: evista 60 mg daily  INTERVAL HISTORY: Sonnet Rizor Gotay 62 y.o. female returns for follow up. Clinically patient is doing well, tolerating the evista well. No n/v/d. No headaches or fevers or chills. No nausea or vomiting. Remainder of the 10 point review of systems.  MEDICAL HISTORY: Past Medical History  Diagnosis Date  . Neuromuscular disorder     nerve damage under left arm  . Depression     takes Celexa daily  . Anxiety    takes Ativan prn  . Insomnia     takes Elavil nightly  . breast ca dx'd 02/1997    left    ALLERGIES:  is allergic to augmentin and sulfa antibiotics.  MEDICATIONS:  Current Outpatient Prescriptions  Medication Sig Dispense Refill  . citalopram (CELEXA) 40 MG tablet Take 40 mg by mouth daily.       Marland Kitchen gabapentin (NEURONTIN) 400 MG capsule Take 800 mg by mouth 4 (four) times daily.      Marland Kitchen HYDROcodone-acetaminophen (NORCO/VICODIN) 5-325 MG per tablet Take 1-2 tablets by mouth every 6 (six) hours as needed for pain.  20 tablet  1  . raloxifene (EVISTA) 60 MG tablet Take 1 tablet (60 mg total) by mouth daily.  30 tablet  6  . vitamin D, CHOLECALCIFEROL, 400 UNITS tablet Take 400 Units by mouth 2 (two) times daily.       Marland Kitchen amitriptyline (ELAVIL) 50 MG tablet Take 50 mg by mouth at bedtime.       Marland Kitchen LORazepam (ATIVAN) 1 MG tablet Take 1 mg by mouth daily as needed for anxiety.        No current facility-administered medications for this visit.    SURGICAL HISTORY:  Past Surgical History  Procedure Laterality Date  . Abdominal hysterectomy  1986  . Breast lumpectomy  1999    Left Breast  . Breast biopsy Right 05/02/2012    Procedure: RIGHT BREAST WIRE GUIDED BIOPSY;  Surgeon: Emelia Loron, MD;  Location: [facility] OR;  Service: General;  Laterality: Right;    REVIEW OF SYSTEMS:  Pertinent items are noted in HPI.   HEALTH MAINTENANCE:  PHYSICAL EXAMINATION: Blood pressure 120/76, pulse 66, temperature  98.8 F (37.1 C), temperature source Oral, resp. rate 20, height 5\' 3"  (1.6 m), weight 121 lb 14.4 oz (55.293 kg). Body mass index is 21.6 kg/(m^2). ECOG PERFORMANCE STATUS: 0 - Asymptomatic   General appearance: alert, cooperative and appears stated age Neck: no adenopathy, no carotid bruit, no JVD, supple, symmetrical, trachea midline and thyroid not enlarged, symmetric, no tenderness/mass/nodules Lymph nodes: Cervical, supraclavicular, and axillary nodes normal. Resp: clear to  auscultation bilaterally Back: symmetric, no curvature. ROM normal. No CVA tenderness. Cardio: regular rate and rhythm, S1, S2 normal, no murmur, click, rub or gallop GI: soft, non-tender; bowel sounds normal; no masses,  no organomegaly Extremities: extremities normal, atraumatic, no cyanosis or edema Neurologic: Grossly normal   LABORATORY DATA: Lab Results  Component Value Date   WBC 4.9 09/06/2012   HGB 13.5 09/06/2012   HCT 40.2 09/06/2012   MCV 92.7 09/06/2012   PLT 218 09/06/2012      Chemistry      Component Value Date/Time   NA 142 09/06/2012 1315   NA 137 05/02/2012 0900   K 4.4 09/06/2012 1315   K 4.3 05/02/2012 0900   CL 102 05/24/2012 1223   CL 101 05/02/2012 0900   CO2 29 09/06/2012 1315   CO2 30 05/02/2012 0900   BUN 10.5 09/06/2012 1315   BUN 20 05/02/2012 0900   CREATININE 0.7 09/06/2012 1315   CREATININE 0.77 05/02/2012 0900      Component Value Date/Time   CALCIUM 8.8 09/06/2012 1315   CALCIUM 9.5 05/02/2012 0900   ALKPHOS 47 09/06/2012 1315   ALKPHOS 49 02/21/2007 0839   AST 17 09/06/2012 1315   AST 18 02/21/2007 0839   ALT 10 09/06/2012 1315   ALT 13 02/21/2007 0839   BILITOT 0.37 09/06/2012 1315   BILITOT 0.4 02/21/2007 0839       RADIOGRAPHIC STUDIES:  No results found.  ASSESSMENT: 62 year old female with   #1 history of left-sided breast cancer requiring lumpectomy and axillary lymph node dissection followed by adjuvant chemotherapy radiation and 5 years of Arimidex [facility]   #2 new diagnosis of atypical ductal hyperplasia of the right breast status post lumpectomy and. No evidence of malignancy was noted. Postoperatively she is doing well. She is seen for discussion of risk reduction for future breast cancers.   #3 patient and I discussed her pathology today and the significance of this. We also in discussed her prior history of cancers. I do think that the patient should be seen by genetic counselor and I have referred her to 1. She also should  be on an antiestrogen therapy for future breast risk reduction. She did not want to be on tamoxifen. I recommended use of Evista for prevention. I explained to her the risks and benefits complications and side effects and benefits. She was agreeable to start this.   #4 we discussed screening tools that are available. I calculated her lifetime risk of developing future breast cancer at 34%. I do think she would qualify by NCCN guidelines to get MRIs I have ordered 1. She should also get yearly mammograms.  #5 begun on evista 60 mg daily  PLAN:   #1 continue evista daily  #2 I will see you her back in 6 months -1years   All questions were answered. The patient knows to call the clinic with any problems, questions or concerns. We can certainly see the patient much sooner if necessary.  I spent 20 minutes counseling the patient  face to face. The total time spent in the appointment was 25 minutes.    Drue Second, MD Medical/Oncology [facility] [phone] (beeper) [phone] (Office)  09/06/2012, 2:03 PM

## 2012-09-06 NOTE — Patient Instructions (Addendum)
Doing well, continue the evista daily  We will repeat a CT chest in 6 months  I will see you in 6 months

## 2012-09-08 ENCOUNTER — Telehealth: Payer: Self-pay | Admitting: Oncology

## 2012-09-08 NOTE — Telephone Encounter (Signed)
lmonvm advising the pt of her 6 mnth f/u appt in march 2015. The rad dept will contact the pt with the ct scan appt.

## 2012-09-23 ENCOUNTER — Other Ambulatory Visit: Payer: Self-pay | Admitting: Dermatology

## 2012-11-30 ENCOUNTER — Telehealth: Payer: Self-pay | Admitting: *Deleted

## 2012-11-30 NOTE — Telephone Encounter (Signed)
Lm informed there pt that KK will be in [facility] ON 03/15/13 during the am. gv appt for 03/15/13 @1pm . Made pt aware that i will mail a letter/avs...td

## 2013-01-04 ENCOUNTER — Other Ambulatory Visit: Payer: Self-pay | Admitting: Oncology

## 2013-02-15 ENCOUNTER — Other Ambulatory Visit: Payer: Self-pay

## 2013-02-15 DIAGNOSIS — Z9889 Other specified postprocedural states: Secondary | ICD-10-CM

## 2013-02-15 DIAGNOSIS — Z1231 Encounter for screening mammogram for malignant neoplasm of breast: Secondary | ICD-10-CM

## 2013-03-09 ENCOUNTER — Other Ambulatory Visit (HOSPITAL_BASED_OUTPATIENT_CLINIC_OR_DEPARTMENT_OTHER): Payer: BC Managed Care – PPO

## 2013-03-09 ENCOUNTER — Ambulatory Visit (HOSPITAL_COMMUNITY)
Admission: RE | Admit: 2013-03-09 | Discharge: 2013-03-09 | Disposition: A | Discharge status: Home / Self Care | Payer: BC Managed Care – PPO | Source: Ambulatory Visit | Attending: Oncology | Admitting: Oncology

## 2013-03-09 ENCOUNTER — Encounter (HOSPITAL_COMMUNITY): Payer: Self-pay

## 2013-03-09 DIAGNOSIS — Z853 Personal history of malignant neoplasm of breast: Secondary | ICD-10-CM

## 2013-03-09 DIAGNOSIS — R918 Other nonspecific abnormal finding of lung field: Secondary | ICD-10-CM | POA: Insufficient documentation

## 2013-03-09 DIAGNOSIS — Z923 Personal history of irradiation: Secondary | ICD-10-CM | POA: Insufficient documentation

## 2013-03-09 LAB — CBC WITH DIFFERENTIAL/PLATELET
BASO%: 1 % (ref 0.0–2.0)
BASOS ABS: 0 10*3/uL (ref 0.0–0.1)
EOS ABS: 0.1 10*3/uL (ref 0.0–0.5)
EOS%: 1.9 % (ref 0.0–7.0)
HCT: 44.5 % (ref 34.8–46.6)
HEMOGLOBIN: 14.9 g/dL (ref 11.6–15.9)
LYMPH#: 1.4 10*3/uL (ref 0.9–3.3)
LYMPH%: 34.1 % (ref 14.0–49.7)
MCH: 31.5 pg (ref 25.1–34.0)
MCHC: 33.4 g/dL (ref 31.5–36.0)
MCV: 94.6 fL (ref 79.5–101.0)
MONO#: 0.3 10*3/uL (ref 0.1–0.9)
MONO%: 8.3 % (ref 0.0–14.0)
NEUT%: 54.7 % (ref 38.4–76.8)
NEUTROS ABS: 2.2 10*3/uL (ref 1.5–6.5)
Platelets: 203 10*3/uL (ref 145–400)
RBC: 4.71 10*6/uL (ref 3.70–5.45)
RDW: 14.1 % (ref 11.2–14.5)
WBC: 4 10*3/uL (ref 3.9–10.3)

## 2013-03-09 LAB — COMPREHENSIVE METABOLIC PANEL (CC13)
ALBUMIN: 3.6 g/dL (ref 3.5–5.0)
ALK PHOS: 51 U/L (ref 40–150)
ALT: 12 U/L (ref 0–55)
AST: 21 U/L (ref 5–34)
Anion Gap: 5 mEq/L (ref 3–11)
BUN: 13 mg/dL (ref 7.0–26.0)
CALCIUM: 9.2 mg/dL (ref 8.4–10.4)
CHLORIDE: 109 meq/L (ref 98–109)
CO2: 29 mEq/L (ref 22–29)
Creatinine: 0.7 mg/dL (ref 0.6–1.1)
GLUCOSE: 89 mg/dL (ref 70–140)
POTASSIUM: 5.1 meq/L (ref 3.5–5.1)
SODIUM: 144 meq/L (ref 136–145)
TOTAL PROTEIN: 6.4 g/dL (ref 6.4–8.3)
Total Bilirubin: 0.31 mg/dL (ref 0.20–1.20)

## 2013-03-09 MED ORDER — IOHEXOL 300 MG/ML  SOLN
80.0000 mL | Freq: Once | INTRAMUSCULAR | Status: AC | PRN
  Administered 2013-03-09: 80 mL via INTRAVENOUS

## 2013-03-15 ENCOUNTER — Ambulatory Visit (HOSPITAL_BASED_OUTPATIENT_CLINIC_OR_DEPARTMENT_OTHER): Payer: BC Managed Care – PPO | Admitting: Oncology

## 2013-03-15 ENCOUNTER — Telehealth: Payer: Self-pay | Admitting: *Deleted

## 2013-03-15 ENCOUNTER — Ambulatory Visit: Payer: BC Managed Care – PPO | Admitting: Oncology

## 2013-03-15 ENCOUNTER — Ambulatory Visit
Admission: RE | Admit: 2013-03-15 | Discharge: 2013-03-15 | Disposition: A | Discharge status: Home / Self Care | Payer: BC Managed Care – PPO | Source: Ambulatory Visit

## 2013-03-15 ENCOUNTER — Encounter: Payer: Self-pay | Admitting: Oncology

## 2013-03-15 VITALS — BP 117/75 | HR 88 | Temp 97.7°F | Resp 18 | Ht 63.0 in | Wt 112.8 lb

## 2013-03-15 DIAGNOSIS — R911 Solitary pulmonary nodule: Secondary | ICD-10-CM

## 2013-03-15 DIAGNOSIS — Z1231 Encounter for screening mammogram for malignant neoplasm of breast: Secondary | ICD-10-CM

## 2013-03-15 DIAGNOSIS — N6089 Other benign mammary dysplasias of unspecified breast: Secondary | ICD-10-CM

## 2013-03-15 DIAGNOSIS — Z853 Personal history of malignant neoplasm of breast: Secondary | ICD-10-CM

## 2013-03-15 DIAGNOSIS — Z9889 Other specified postprocedural states: Secondary | ICD-10-CM

## 2013-03-15 NOTE — Patient Instructions (Signed)
Pulmonary Nodule A pulmonary nodule is a small, round growth of tissue in the lung. Pulmonary nodules can range in size from less than 1/5 inch (4 mm) to a little bigger than an inch (25 mm). Most pulmonary nodules are detected when imaging tests of the lung are being performed for a different problem. Pulmonary nodules are usually not cancerous (benign). However, some pulmonary nodules are cancerous (malignant). Follow-up treatment or testing is based on the size of the pulmonary nodule and your risk of getting lung cancer.  CAUSES Benign pulmonary nodules can be caused by various things. Some of the causes include:   Bacterial, fungal, or viral infections. This is usually an old infection that is no longer active, but it can sometimes be a current, active infection.  A benign mass of tissue.  Inflammation from conditions such as rheumatoid arthritis.   Abnormal blood vessels in the lungs. Malignant pulmonary nodules can result from lung cancer or from cancers that spread to the lung from other places in the body. SIGNS AND SYMPTOMS Pulmonary nodules usually do not cause symptoms. DIAGNOSIS Most often, pulmonary nodules are found incidentally when an X-ray or CT scan is performed to look for some other problem in the lung area. To help determine whether a pulmonary nodule is benign or malignant, your health care provider will take a medical history and order a variety of tests. Tests done may include:   Blood tests.  A skin test called a tuberculin test. This test is used to determine if you have been exposed to the germ that causes tuberculosis.   Chest X-rays. If possible, a new X-ray may be compared with X-rays you have had in the past.   CT scan. This test shows smaller pulmonary nodules more clearly than an X-ray.   Positron emission tomography (PET) scan. In this test, a safe amount of a radioactive substance is injected into the bloodstream. Then, the scan takes a picture of  the pulmonary nodule. The radioactive substance is eliminated from your body in your urine.   Biopsy. A tiny piece of the pulmonary nodule is removed so it can be checked under a microscope. TREATMENT  Pulmonary nodules that are benign normally do not require any treatment because they usually do not cause symptoms or breathing problems. Your health care provider may want to monitor the pulmonary nodule through follow-up CT scans. The frequency of these CT scans will vary based on the size of the nodule and the risk factors for lung cancer. For example, CT scans will need to be done more frequently if the pulmonary nodule is larger and if you have a history of smoking and a family history of cancer. Further testing or biopsies may be done if any follow-up CT scan shows that the size of the pulmonary nodule has increased. HOME CARE INSTRUCTIONS  Only take over-the-counter or prescription medicines as directed by your health care provider.  Keep all follow-up appointments with your health care provider. SEEK MEDICAL CARE IF:  You have trouble breathing when you are active.   You feel sick or unusually tired.   You do not feel like eating.   You lose weight without trying to.   You develop chills or night sweats.  SEEK IMMEDIATE MEDICAL CARE IF:  You cannot catch your breath, or you begin wheezing.   You cannot stop coughing.   You cough up blood.   You become dizzy or feel like you are going to pass out.   You  have sudden chest pain.   You have a fever or persistent symptoms for more than 2 3 days.   You have a fever and your symptoms suddenly get worse. MAKE SURE YOU:  Understand these instructions.  Will watch your condition.  Will get help right away if you are not doing well or get worse. Document Released: 10/19/2008 Document Revised: 08/24/2012 Document Reviewed: 06/13/2012 [employer] [facility] Patient Information 2014 [place].  Breast Cancer Survivor  Follow-Up Breast cancer begins when cells in the breast divide too rapidly. The extra cells form a lump (tumor). When the cancer is treated, the goal is to get rid of all cancer cells. However, sometimes a few cells survive. These cancer cells can then grow. They become recurrent cancer. This means the cancer comes back after treatment.  Most cases of recurrent breast cancer develop 3 to 5 years after treatment. However, sometimes it comes back just a few months after treatment. Other times, it does not come back until years later. If the cancer comes back in the same area as the first breast cancer, it is called a local recurrence. If the cancer comes back somewhere else in the body, it is called regional recurrence if the site is fairly near the breast or distant recurrence if it is far from the breast. Your caregiver may also use the term metastasize to indicate a cancer that has gone to another part of your body. Treatment is still possible after either kind of recurrence. The cancer can still be controlled.  CAUSES OF RECURRENT CANCER No one knows exactly why breast cancer starts in the first place. Why the cancer comes back after treatment is also not clear. It is known that certain conditions, called risk factors, can make this more likely. They include:  Developing breast cancer for the first time before age 32.  Having breast cancer that involves the lymph nodes. These are small, round pieces of tissue found all over the body. Their job is to help fight infections.  Having a large tumor. Cancer is more apt to come back if the first tumor was bigger than 2 inches (5 cm).  Having certain types of breast cancer, such as:  Inflammatory breast cancer. This rare type grows rapidly and causes the breast to become red and swollen.  A high-grade tumor. The grade of a tumor indicates how fast it will grow and spread. High-grade tumors grow more quickly than other types.  HER2 cancer. This refers to  the tumor's genetic makeup. Tumors that have this type of gene are more likely to come back after treatment.  Having close tumor margins. This refers to the space between the tumor and normal, noncancerous cells. If the space is small, the tumor has a greater chance of coming back.  Having treatment involving a surgery to remove the tumor but not the entire breast (lumpectomy) and no radiation therapy. CARE AFTER BREAST CANCER Home Monitoring Women who have had breast cancer should continue to examine their breasts every month. The goal is to catch the cancer quickly if it comes back. Many women find it helpful to do so on the same day each month and to mark the calendar as a reminder. Let your caregiver know immediately if you have any signs of recurrent breast cancer. Symptoms will vary, depending on where the cancer recurs. The original type of treatment can also make a difference. Symptoms of local recurrence after a lumpectomy or a recurrence in the opposite breast may include:  A  new lump or thickening in the breast.  A change in the way the skin looks on the breast (such as a rash, dimpling, or wrinkling).  Redness or swelling of the breast.  Changes in the nipple (such as being red, puckered, swollen, or leaking fluid). Symptoms of a recurrence after a breast removal surgery (mastectomy) may include:  A lump or thickening under the skin.  A thickening around the mastectomy scar. Symptoms of regional recurrence in the lymph nodes near the breast may include:  A lump under the arm or above the collarbone.  Swelling of the arm.  Pain in the arm, shoulder, or chest.  Numbness in the hand or arm. Symptoms of distant recurrence may include:  A cough that does not go away.  Trouble breathing or shortness of breath.  Pain in the bones or the chest. This is pain that lasts or does not respond to rest and medicine.  Headaches.  Sudden vision problems.  Dizziness.  Nausea or  vomiting.  Losing weight without trying to.  Persistent abdominal pain.  Changes in bowel movements or blood in the stool.  Yellowing of the skin or eyes (jaundice).  Blood in the urine or bloody vaginal discharge. Clinical Monitoring  It is helpful to keep a schedule of appointments for needed tests and exams. This includes physical exams, breast exams, exams of the lymph nodes, and general exams.  For the first 3 years after being treated for breast cancer, see your caregiver every 3 to 6 months.  For years 4 and 5 after breast cancer, see your caregiver every 6 to 12 months.  After 5 years, see your caregiver at least once a year.  Regular breast X-rays (mammograms) should continue even if you had a mastectomy.  A mammogram should be done 1 year after the mammogram that first detected breast cancer.  A mammogram should be done every 6 to 12 months after that. Follow your caregiver's advice.  A pelvic exam done by your caregiver checks whether female organs are the normal size and shape. The exam is usually done every year. Ask your caregiver if that schedule is right for you.  Women taking tamoxifen should report any vaginal bleeding immediately to their caregiver. Tamoxifen is often given to women with a certain type of breast cancer. It has been shown to help prevent recurrence.  You will need to decide who your primary caregiver will be.  Most people continue to see their cancer specialist (oncologist) every 3 to 6 months for the first year after cancer treatment.  At some point, you may want to go back to seeing your family caregiver. You would no longer see your oncologist for regular checkups. Many women do this about 1 year after their first diagnosis of breast cancer.  You will still need to be seen every so often by your oncologist. Ask how often that should be. Coordinate this with your family or primary caregiver.  Think about having genetic counseling. This would  provide information on traits that can be passed or inherited from one generation to the next. In some cases, breast cancer runs in families. Tell your caregiver if you:  Are of Ashkenazi Jewish heritage.  Have any family member who has had ovarian cancer.  Have a mother, sister, or daughter who had breast cancer before age 99.  Have 2 or more close relatives who have had breast cancer. This means a mother, sister, daughter, aunt, or grandmother.  Had breast cancer in both  breasts.  Have a female relative who has had breast cancer.  Some tests are not recommended for routine screening. Someone recovering from breast cancer does not need to have these tests if there are no problems. The tests have risks, such as radiation exposure, and can be costly. The risks of these tests are thought to be greater than the benefits:  Blood tests.  Chest X-rays.  Bone scans.  Liver ultrasound.  Computed tomography (CT scan).  Positron emission tomography (PET scan).  Magnetic resonance imaging (MRI scan). DIAGNOSIS OF RECURRENT CANCER Recurrent breast cancer may be suspected for various reasons. A mammogram may not look normal. You might feel a lump or have other symptoms. Your caregiver may find something unusual during an exam. To be sure, your caregiver will probably order some tests. The tests are needed because there are symptoms or hints of a problem. They could include:  Blood tests, including a test to check how well the liver is working. The liver is a common site for a distant cancer recurrence.  Imaging tests that create pictures of the inside of the body. These tests include:  Chest X-rays to show if the cancer has come back in the lungs.  CT scans to create detailed pictures of various areas of the body and help find a distant recurrence.  MRI scans to find anything unusual in the breast, chest, or lymph nodes.  Breast ultrasound tests to examine the breasts.  Bone scans to  create a picture of your whole skeleton and find cancer in bony areas.  PET scans to create an image of the whole body. PET scans can be used together with CT scans to show more detail.  Biopsy. A small sample of tissue is taken and checked under a microscope. If cancer cells are found, they may be tested to see if they contain the HER2 gene or the hormones estrogen and progesterone. This will help your caregiver decide how to treat the recurrent cancer. TREATMENT  How recurrent breast cancer is treated depends on where the new cancer is found. The type of treatment that was used for the first breast cancer makes a difference, too. A combination of treatments may be used. Options include:  Surgery.  If the cancer comes back in the breast that was not treated before, you may need a lumpectomy or mastectomy.  If the cancer comes back in the breast that was treated before, you may need a mastectomy.  The lymph nodes under the arm may need to be removed.  Radiation therapy.  For a local recurrence, radiation may be used if it was not used during the first treatment.  For a distance recurrence, radiation is sometimes used.  Chemotherapy.  This may be used before surgery to treat recurrent breast cancer.  This may be used to treat recurrent cancer that cannot be treated with surgery.  This may be used to treat a distant recurrence.  Hormone therapy.  Women with the HER2 gene may be given hormone therapy to attack this gene. Document Released: 08/20/2010 Document Revised: 03/16/2011 Document Reviewed: 08/20/2010 [facility] Patient Information 2014 [place], [place].

## 2013-03-15 NOTE — Telephone Encounter (Signed)
appts made and printed. Pt is aware that cs will call her w/ appt for CT...td

## 2013-03-15 NOTE — Progress Notes (Signed)
OFFICE PROGRESS NOTE  CC**  Leonides Sake, MD Dr. Cristela Blue Hamrick [address] [place] [address] Dr. Rolm Bookbinder  DIAGNOSIS: 63 year old female with prior history of left breast cancer 1999. Recently mammogram showed calcifications and lumpectomy revealed atypical ductal hyperplasia  STAGE:  Atypical ductal hyperplasia (right breast)  History of left breast cancer 1999 status post lumpectomy axillary lymph node dissection followed by chemoradiation therapy and 5 years of tamoxifen. (stage unknown)  PRIOR THERAPY:  #1 patient with or history of left breast cancer originally treated in 1999 by lumpectomy done by Dr. March Rummage who has since retired. Her chemotherapy was given by Dr. Craig Staggers, radiation by Dr. Shelby Dubin. She then received 5 years of tamoxifen. 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 Patient recently had a screening mammogram performed that revealed new microcalcifications in the mid to lower right breast outer portion.she underwent stereotactic core biopsy that only showed fibrocystic changes  #2 She therefore was seen by Dr. Rolm Bookbinder and patient underwent an breast biopsy with needle localization on 05/02/2012.the pathology showed atypical ductal hyperplasia with fibrocystic changes.she is now referred to medical oncology for discussion of future breast cancer risk reduction and possibly antiestrogen therapy and screening recommendations  #3 Began evista 60 mg daily on 05/24/12    CURRENT THERAPY: evista 60 mg daily  INTERVAL HISTORY: Candace Howe 63 y.o. female returns for follow up. Overall patient is doing well. She's tolerating the Evista without any problems. She is concerned about weight loss. Since her last visit she's lost about 8 pounds. She states that she is not eating much. I've encouraged her to eat protein dense and nutrition dense foods to try to gain her weight. She had a CT of the chest performed her pulmonary  nodule is stable. We will get another scan in about a year. Today she denies any headaches double vision blurring of vision fevers chills night sweats. No shortness of breath chest pains palpitations. No abdominal pain no diarrhea or constipation. She has no easy bruising or bleeding. She has no myalgias and arthralgias. No peripheral paresthesias or gait disturbances. Remainder of the 10 point review of systems is negative.   MEDICAL HISTORY: Past Medical History  Diagnosis Date  . Neuromuscular disorder     nerve damage under left arm  . Depression     takes Celexa daily  . Anxiety     takes Ativan prn  . Insomnia     takes Elavil nightly  . breast ca dx'd 02/1997    left    ALLERGIES:  is allergic to augmentin and sulfa antibiotics.  MEDICATIONS:  Current Outpatient Prescriptions  Medication Sig Dispense Refill  . DULoxetine (CYMBALTA) 30 MG capsule       . gabapentin (NEURONTIN) 400 MG capsule Take 800 mg by mouth 4 (four) times daily.      Marland Kitchen HYDROcodone-acetaminophen (NORCO/VICODIN) 5-325 MG per tablet Take 1-2 tablets by mouth every 6 (six) hours as needed for pain.  20 tablet  1  . raloxifene (EVISTA) 60 MG tablet TAKE 1 TABLET BY MOUTH EVERY DAY  30 tablet  3  . amitriptyline (ELAVIL) 50 MG tablet Take 50 mg by mouth at bedtime.       . citalopram (CELEXA) 40 MG tablet Take 40 mg by mouth daily.       Marland Kitchen LORazepam (ATIVAN) 1 MG tablet Take 1 mg by mouth daily as needed for anxiety.       . vitamin  D, CHOLECALCIFEROL, 400 UNITS tablet Take 400 Units by mouth 2 (two) times daily.        No current facility-administered medications for this visit.    SURGICAL HISTORY:  Past Surgical History  Procedure Laterality Date  . Abdominal hysterectomy  1986  . Breast lumpectomy  1999    Left Breast  . Breast biopsy Right 05/02/2012    Procedure: RIGHT BREAST WIRE GUIDED BIOPSY;  Surgeon: Rolm Bookbinder, MD;  Location: [place];  Service: General;  Laterality: Right;    REVIEW OF  SYSTEMS:  Pertinent items are noted in HPI.   HEALTH MAINTENANCE:  PHYSICAL EXAMINATION: Blood pressure 117/75, pulse 88, temperature 97.7 F (36.5 C), temperature source Oral, resp. rate 18, height 5\' 3"  (1.6 m), weight 112 lb 12.8 oz (51.166 kg). Body mass index is 19.99 kg/(m^2). ECOG PERFORMANCE STATUS: 0 - Asymptomatic   General appearance: alert, cooperative and appears stated age Neck: no adenopathy, no carotid bruit, no JVD, supple, symmetrical, trachea midline and thyroid not enlarged, symmetric, no tenderness/mass/nodules Lymph nodes: Cervical, supraclavicular, and axillary nodes normal. Resp: clear to auscultation bilaterally Back: symmetric, no curvature. ROM normal. No CVA tenderness. Cardio: regular rate and rhythm, S1, S2 normal, no murmur, click, rub or gallop GI: soft, non-tender; bowel sounds normal; no masses,  no organomegaly Extremities: extremities normal, atraumatic, no cyanosis or edema Neurologic: Grossly normal   LABORATORY DATA: Lab Results  Component Value Date   WBC 4.0 03/09/2013   HGB 14.9 03/09/2013   HCT 44.5 03/09/2013   MCV 94.6 03/09/2013   PLT 203 03/09/2013      Chemistry      Component Value Date/Time   NA 144 03/09/2013 0916   NA 137 05/02/2012 0900   K 5.1 03/09/2013 0916   K 4.3 05/02/2012 0900   CL 102 05/24/2012 1223   CL 101 05/02/2012 0900   CO2 29 03/09/2013 0916   CO2 30 05/02/2012 0900   BUN 13.0 03/09/2013 0916   BUN 20 05/02/2012 0900   CREATININE 0.7 03/09/2013 0916   CREATININE 0.77 05/02/2012 0900      Component Value Date/Time   CALCIUM 9.2 03/09/2013 0916   CALCIUM 9.5 05/02/2012 0900   ALKPHOS 51 03/09/2013 0916   ALKPHOS 49 02/21/2007 0839   AST 21 03/09/2013 0916   AST 18 02/21/2007 0839   ALT 12 03/09/2013 0916   ALT 13 02/21/2007 0839   BILITOT 0.31 03/09/2013 0916   BILITOT 0.4 02/21/2007 0839       RADIOGRAPHIC STUDIES:  No results found.  ASSESSMENT: 63 year old female with   #1 history of left-sided breast cancer requiring  lumpectomy and axillary lymph node dissection followed by adjuvant chemotherapy radiation and 5 years of Ar[facility]x [facility]   #2 new diagnosis of atypical ductal hyperplasia of the right breast status post lumpectomy and. No evidence of malignancy was noted. Postoperatively she is doing well. She is seen for discussion of risk reduction for future breast cancers.   #3 patient and I discussed her pathology today and the significance of this. We also in discussed her prior history of cancers. I do think that the patient should be seen by genetic counselor and I have referred her to 1. She also should be on an antiestrogen therapy for future breast risk reduction. She did not want to be on tamoxifen. I recommended use of Evista for prevention. I explained to her the risks and benefits complications and side effects and benefits. She  was agreeable to start this.   #4 we discussed screening tools that are available. I calculated her lifetime risk of developing future breast cancer at 34%. I do think she would qualify by NCCN guidelines to get MRIs I have ordered 1. She should also get yearly mammograms.  #5  on evista 60 mg daily  PLAN:   #1 continue evista daily  #2 repeat CT chest without contrast for follow up of pulmonary nodule  #3 I will see  her back in 1years   All questions were answered. The patient knows to call the clinic with any problems, questions or concerns. We can certainly see the patient much sooner if necessary.  I spent 15 minutes counseling the patient face to face. The total time spent in the appointment was 25 minutes.    Marcy Panning, MD Medical/Oncology [facility] [phone] (beeper) [phone] (Office)  03/15/2013, 1:34 PM

## 2013-05-20 ENCOUNTER — Other Ambulatory Visit: Payer: Self-pay | Admitting: Oncology

## 2013-05-20 DIAGNOSIS — Z853 Personal history of malignant neoplasm of breast: Secondary | ICD-10-CM

## 2013-09-07 ENCOUNTER — Other Ambulatory Visit: Payer: Self-pay | Admitting: Dermatology

## 2013-09-12 ENCOUNTER — Other Ambulatory Visit: Payer: Self-pay | Admitting: Dermatology

## 2013-11-24 ENCOUNTER — Other Ambulatory Visit: Payer: Self-pay | Admitting: Dermatology

## 2013-12-19 ENCOUNTER — Telehealth: Payer: Self-pay | Admitting: Hematology and Oncology

## 2013-12-19 NOTE — Telephone Encounter (Signed)
, °

## 2014-02-15 ENCOUNTER — Other Ambulatory Visit: Payer: Self-pay | Admitting: Family Medicine

## 2014-02-15 DIAGNOSIS — M8580 Other specified disorders of bone density and structure, unspecified site: Secondary | ICD-10-CM

## 2014-02-15 DIAGNOSIS — Z1231 Encounter for screening mammogram for malignant neoplasm of breast: Secondary | ICD-10-CM

## 2014-03-07 ENCOUNTER — Other Ambulatory Visit: Payer: Self-pay

## 2014-03-07 DIAGNOSIS — Z853 Personal history of malignant neoplasm of breast: Secondary | ICD-10-CM

## 2014-03-08 ENCOUNTER — Ambulatory Visit (HOSPITAL_COMMUNITY)
Admission: RE | Admit: 2014-03-08 | Discharge: 2014-03-08 | Disposition: A | Discharge status: Home / Self Care | Payer: BC Managed Care – PPO | Source: Ambulatory Visit | Attending: Oncology | Admitting: Oncology

## 2014-03-08 ENCOUNTER — Other Ambulatory Visit (HOSPITAL_BASED_OUTPATIENT_CLINIC_OR_DEPARTMENT_OTHER): Payer: BC Managed Care – PPO

## 2014-03-08 DIAGNOSIS — R911 Solitary pulmonary nodule: Secondary | ICD-10-CM | POA: Insufficient documentation

## 2014-03-08 DIAGNOSIS — Z853 Personal history of malignant neoplasm of breast: Secondary | ICD-10-CM

## 2014-03-08 LAB — COMPREHENSIVE METABOLIC PANEL (CC13)
ALBUMIN: 3.7 g/dL (ref 3.5–5.0)
ALT: 8 U/L (ref 0–55)
ANION GAP: 7 meq/L (ref 3–11)
AST: 20 U/L (ref 5–34)
Alkaline Phosphatase: 60 U/L (ref 40–150)
BUN: 14 mg/dL (ref 7.0–26.0)
CALCIUM: 9.3 mg/dL (ref 8.4–10.4)
CO2: 30 meq/L — AB (ref 22–29)
Chloride: 106 mEq/L (ref 98–109)
Creatinine: 0.8 mg/dL (ref 0.6–1.1)
EGFR: 83 mL/min/{1.73_m2} — ABNORMAL LOW (ref >90)
Glucose: 97 mg/dl (ref 70–140)
POTASSIUM: 4.7 meq/L (ref 3.5–5.1)
SODIUM: 143 meq/L (ref 136–145)
Total Bilirubin: 0.4 mg/dL (ref 0.20–1.20)
Total Protein: 6.3 g/dL — ABNORMAL LOW (ref 6.4–8.3)

## 2014-03-08 LAB — CBC WITH DIFFERENTIAL/PLATELET
BASO%: 1 % (ref 0.0–2.0)
Basophils Absolute: 0 10*3/uL (ref 0.0–0.1)
EOS%: 1 % (ref 0.0–7.0)
Eosinophils Absolute: 0 10*3/uL (ref 0.0–0.5)
HEMATOCRIT: 45.9 % (ref 34.8–46.6)
HGB: 15.1 g/dL (ref 11.6–15.9)
LYMPH#: 1.2 10*3/uL (ref 0.9–3.3)
LYMPH%: 25.9 % (ref 14.0–49.7)
MCH: 31.2 pg (ref 25.1–34.0)
MCHC: 32.8 g/dL (ref 31.5–36.0)
MCV: 94.9 fL (ref 79.5–101.0)
MONO#: 0.4 10*3/uL (ref 0.1–0.9)
MONO%: 7.6 % (ref 0.0–14.0)
NEUT#: 3.1 10*3/uL (ref 1.5–6.5)
NEUT%: 64.5 % (ref 38.4–76.8)
Platelets: 227 10*3/uL (ref 145–400)
RBC: 4.83 10*6/uL (ref 3.70–5.45)
RDW: 13.4 % (ref 11.2–14.5)
WBC: 4.8 10*3/uL (ref 3.9–10.3)

## 2014-03-16 ENCOUNTER — Telehealth: Payer: Self-pay | Admitting: Hematology and Oncology

## 2014-03-16 ENCOUNTER — Ambulatory Visit (HOSPITAL_BASED_OUTPATIENT_CLINIC_OR_DEPARTMENT_OTHER): Payer: BC Managed Care – PPO | Admitting: Hematology and Oncology

## 2014-03-16 VITALS — BP 116/66 | HR 64 | Temp 98.4°F | Resp 18 | Ht 63.0 in | Wt 110.5 lb

## 2014-03-16 DIAGNOSIS — R911 Solitary pulmonary nodule: Secondary | ICD-10-CM

## 2014-03-16 DIAGNOSIS — Z853 Personal history of malignant neoplasm of breast: Secondary | ICD-10-CM

## 2014-03-16 NOTE — Telephone Encounter (Signed)
Confirm appointment for March 2017. Mailed calendar

## 2014-03-16 NOTE — Assessment & Plan Note (Signed)
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 (stage unknown); Her chemotherapy was given by Dr. Craig Staggers, radiation by Dr. Shelby Dubin  Current treatment: Evista 60 mg daily  Breast cancer surveillance: 1. Breast exam 03/16/2014 is normal 2. Mammograms to be done 03/20/2014  Lung nodule follow-up: CT chest on 03/08/2014 revealed stable findings and they're unchanged from 06/10/2012 noncontrast CT scan 1 year is recommended.  Return to clinic in 1 year for follow-up in the survivorship clinic

## 2014-03-16 NOTE — Progress Notes (Signed)
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 (stage unknown); Her chemotherapy was given by Dr. Craig Staggers, radiation by Dr. Shelby Dubin  CHIEF COMPLIANT:  follow-up of history of breast cancer   INTERVAL HISTORY: Candace Howe is a 64 year old lady with above-mentioned history of breast cancer 1999 treated with lumpectomy and radiation adjuvant chemotherapy and hormonal therapy with tamoxifen for 5 years. She had atypical ductal hyperplasia in 2014. She is doing very well from both of these issues. She takes Evista for bone health. She denies any new lumps or nodules in the breasts. She is otherwise feeling healthy.   REVIEW OF SYSTEMS:   Constitutional: Denies fevers, chills or abnormal weight loss Eyes: Denies blurriness of vision Ears, nose, mouth, throat, and face: Denies mucositis or sore throat Respiratory: Denies cough, dyspnea or wheezes Cardiovascular: Denies palpitation, chest discomfort or lower extremity swelling Gastrointestinal:  Denies nausea, heartburn or change in bowel habits Skin: Denies abnormal skin rashes Lymphatics: Denies new lymphadenopathy or easy bruising Neurological:Denies numbness, tingling or new weaknesses Behavioral/Psych: Mood is stable, no new changes  Breast:  denies any pain or lumps or nodules in either breasts All other systems were reviewed with the patient and are negative.  I have reviewed the past medical history, past surgical history, social history and family history with the patient and they are unchanged from previous note.  ALLERGIES:  is allergic to augmentin and sulfa antibiotics.  MEDICATIONS:  Current Outpatient Prescriptions  Medication Sig Dispense Refill  . gabapentin (NEURONTIN) 400  MG capsule Take 800 mg by mouth 4 (four) times daily.    Marland Kitchen LORazepam (ATIVAN) 1 MG tablet Take 1 mg by mouth daily as needed for anxiety.     . raloxifene (EVISTA) 60 MG tablet TAKE 1 TABLET BY MOUTH EVERY DAY 30 tablet 9  . amitriptyline (ELAVIL) 50 MG tablet Take 50 mg by mouth at bedtime.     . citalopram (CELEXA) 40 MG tablet Take 40 mg by mouth daily.     Marland Kitchen HYDROcodone-acetaminophen (NORCO) 7.5-325 MG per tablet     . HYDROcodone-acetaminophen (NORCO/VICODIN) 5-325 MG per tablet Take 1-2 tablets by mouth every 6 (six) hours as needed for pain. (Patient not taking: Reported on 03/16/2014) 20 tablet 1  . vitamin D, CHOLECALCIFEROL, 400 UNITS tablet Take 400 Units by mouth 2 (two) times daily.      No current facility-administered medications for this visit.    PHYSICAL EXAMINATION: ECOG PERFORMANCE STATUS: 0 - Asymptomatic  Filed Vitals:   03/16/14 1033  BP: 116/66  Pulse: 64  Temp: 98.4 F (36.9 C)  Resp: 18   Filed Weights   03/16/14 1033  Weight: 110 lb 8 oz (50.122 kg)    GENERAL:alert, no distress and comfortable SKIN: skin color, texture, turgor are normal, no rashes or significant lesions EYES: normal, Conjunctiva are pink and non-injected, sclera clear OROPHARYNX:no exudate, no erythema and lips, buccal mucosa, and tongue normal  NECK: supple, thyroid normal size, non-tender, without nodularity LYMPH:  no palpable lymphadenopathy in the cervical, axillary or inguinal LUNGS: clear to auscultation and percussion with normal breathing effort HEART: regular rate & rhythm and no murmurs and no lower extremity edema ABDOMEN:abdomen soft, non-tender and normal bowel sounds Musculoskeletal:no cyanosis of digits and no clubbing  NEURO: alert &  oriented x 3 with fluent speech, no focal motor/sensory deficits BREAST: No palpable masses or nodules in either right or left breasts. No palpable axillary supraclavicular or infraclavicular adenopathy no breast tenderness or nipple  discharge. (exam performed in the presence of a chaperone)  LABORATORY DATA:  I have reviewed the data as listed   Chemistry      Component Value Date/Time   NA 143 03/08/2014 0804   NA 137 05/02/2012 0900   K 4.7 03/08/2014 0804   K 4.3 05/02/2012 0900   CL 102 05/24/2012 1223   CL 101 05/02/2012 0900   CO2 30* 03/08/2014 0804   CO2 30 05/02/2012 0900   BUN 14.0 03/08/2014 0804   BUN 20 05/02/2012 0900   CREATININE 0.8 03/08/2014 0804   CREATININE 0.77 05/02/2012 0900      Component Value Date/Time   CALCIUM 9.3 03/08/2014 0804   CALCIUM 9.5 05/02/2012 0900   ALKPHOS 60 03/08/2014 0804   ALKPHOS 49 02/21/2007 0839   AST 20 03/08/2014 0804   AST 18 02/21/2007 0839   ALT 8 03/08/2014 0804   ALT 13 02/21/2007 0839   BILITOT 0.40 03/08/2014 0804   BILITOT 0.4 02/21/2007 0839       Lab Results  Component Value Date   WBC 4.8 03/08/2014   HGB 15.1 03/08/2014   HCT 45.9 03/08/2014   MCV 94.9 03/08/2014   PLT 227 03/08/2014   NEUTROABS 3.1 03/08/2014   ASSESSMENT & PLAN:  History of breast cancer in female 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 (stage unknown); Her chemotherapy was given by Dr. Craig Staggers, radiation by Dr. Shelby Dubin  Current treatment: Evista 60 mg daily  Breast cancer surveillance: 1. Breast exam 03/16/2014 is normal 2. Mammograms to be done 03/20/2014  Lung nodule follow-up: CT chest on 03/08/2014 revealed stable findings and they're unchanged from 06/10/2012 noncontrast CT scan 1 year is recommended.  Return to clinic in 1 year for follow-up   No orders of the defined types were placed in this encounter.   The patient has a good understanding of the overall plan. she agrees with it. She will call with any problems that may develop before her next visit here.   Rulon Eisenmenger, MD

## 2014-03-20 ENCOUNTER — Ambulatory Visit
Admission: RE | Admit: 2014-03-20 | Discharge: 2014-03-20 | Disposition: A | Discharge status: Home / Self Care | Payer: BC Managed Care – PPO | Source: Ambulatory Visit | Attending: Family Medicine | Admitting: Family Medicine

## 2014-03-20 DIAGNOSIS — M8580 Other specified disorders of bone density and structure, unspecified site: Secondary | ICD-10-CM

## 2014-03-20 DIAGNOSIS — Z1231 Encounter for screening mammogram for malignant neoplasm of breast: Secondary | ICD-10-CM

## 2014-10-21 ENCOUNTER — Telehealth: Payer: Self-pay | Admitting: Hematology and Oncology

## 2014-10-21 NOTE — Telephone Encounter (Signed)
lvm for pt regaring to 3.10 appt moved to 3.13 due to md onpal....mailed pt appt sched and letter

## 2015-03-15 ENCOUNTER — Other Ambulatory Visit: Payer: BC Managed Care – PPO

## 2015-03-15 ENCOUNTER — Ambulatory Visit: Payer: BC Managed Care – PPO | Admitting: Hematology and Oncology

## 2015-03-16 NOTE — Assessment & Plan Note (Signed)
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 (stage unknown); Her chemotherapy was given by Dr. Craig Staggers, radiation by Dr. Shelby Dubin  Current treatment: Evista 60 mg daily  Breast cancer surveillance: 1. Breast exam 03/16/2014 is normal 2. Mammograms to be done 03/20/2014  Lung nodule follow-up: CT chest on 03/08/2014 revealed stable findings and they're unchanged from 06/10/2012 noncontrast CT scan 1 year is recommended.  Return to clinic in 1 year for follow-up

## 2015-03-18 ENCOUNTER — Other Ambulatory Visit: Payer: Self-pay | Admitting: *Deleted

## 2015-03-18 ENCOUNTER — Other Ambulatory Visit (HOSPITAL_BASED_OUTPATIENT_CLINIC_OR_DEPARTMENT_OTHER): Payer: BC Managed Care – PPO

## 2015-03-18 ENCOUNTER — Ambulatory Visit (HOSPITAL_BASED_OUTPATIENT_CLINIC_OR_DEPARTMENT_OTHER): Payer: BC Managed Care – PPO | Admitting: Hematology and Oncology

## 2015-03-18 ENCOUNTER — Other Ambulatory Visit: Payer: Self-pay | Admitting: Hematology and Oncology

## 2015-03-18 ENCOUNTER — Encounter: Payer: Self-pay | Admitting: Hematology and Oncology

## 2015-03-18 VITALS — BP 94/56 | HR 82 | Temp 97.8°F | Resp 18 | Ht 63.0 in | Wt 109.1 lb

## 2015-03-18 DIAGNOSIS — Z853 Personal history of malignant neoplasm of breast: Secondary | ICD-10-CM

## 2015-03-18 DIAGNOSIS — R918 Other nonspecific abnormal finding of lung field: Secondary | ICD-10-CM | POA: Insufficient documentation

## 2015-03-18 DIAGNOSIS — R2231 Localized swelling, mass and lump, right upper limb: Secondary | ICD-10-CM

## 2015-03-18 LAB — CBC WITH DIFFERENTIAL/PLATELET
BASO%: 0.7 % (ref 0.0–2.0)
Basophils Absolute: 0 10*3/uL (ref 0.0–0.1)
EOS ABS: 0 10*3/uL (ref 0.0–0.5)
EOS%: 0.6 % (ref 0.0–7.0)
HCT: 48.2 % — ABNORMAL HIGH (ref 34.8–46.6)
HEMOGLOBIN: 15.7 g/dL (ref 11.6–15.9)
LYMPH%: 34 % (ref 14.0–49.7)
MCH: 30.5 pg (ref 25.1–34.0)
MCHC: 32.6 g/dL (ref 31.5–36.0)
MCV: 93.3 fL (ref 79.5–101.0)
MONO#: 0.3 10*3/uL (ref 0.1–0.9)
MONO%: 6.4 % (ref 0.0–14.0)
NEUT#: 2.8 10*3/uL (ref 1.5–6.5)
NEUT%: 58.3 % (ref 38.4–76.8)
Platelets: 223 10*3/uL (ref 145–400)
RBC: 5.17 10*6/uL (ref 3.70–5.45)
RDW: 14 % (ref 11.2–14.5)
WBC: 4.9 10*3/uL (ref 3.9–10.3)
lymph#: 1.7 10*3/uL (ref 0.9–3.3)

## 2015-03-18 LAB — COMPREHENSIVE METABOLIC PANEL
ALT: 10 U/L (ref 0–55)
AST: 16 U/L (ref 5–34)
Albumin: 3.5 g/dL (ref 3.5–5.0)
Alkaline Phosphatase: 53 U/L (ref 40–150)
Anion Gap: 7 mEq/L (ref 3–11)
BUN: 19.3 mg/dL (ref 7.0–26.0)
CHLORIDE: 105 meq/L (ref 98–109)
CO2: 31 mEq/L — ABNORMAL HIGH (ref 22–29)
Calcium: 8.9 mg/dL (ref 8.4–10.4)
Creatinine: 0.8 mg/dL (ref 0.6–1.1)
EGFR: 77 mL/min/{1.73_m2} — AB (ref >90)
GLUCOSE: 108 mg/dL (ref 70–140)
POTASSIUM: 4.3 meq/L (ref 3.5–5.1)
SODIUM: 143 meq/L (ref 136–145)
Total Bilirubin: 0.37 mg/dL (ref 0.20–1.20)
Total Protein: 6.5 g/dL (ref 6.4–8.3)

## 2015-03-18 NOTE — Progress Notes (Signed)
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 (stage unknown); Her chemotherapy was given by Dr. Burney Gauze, radiation by Dr. Shelby Dubin  CHIEF COMPLIANT: follow-up of history of breast cancer   INTERVAL HISTORY: Candace Howe is a 65 year old with above-mentioned history of left breast cancer 1999 underwent lumpectomy with axillary lymph node dissection followed by chemotherapy and radiation in 5 years of tamoxifen therapy. 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 She was diagnosed with atypical ductal hyperplasia in 2014 and she is currently taking Evista for risk reduction.  REVIEW OF SYSTEMS:   Constitutional: Denies fevers, chills or abnormal weight loss Eyes: Denies blurriness of vision Ears, nose, mouth, throat, and face: Denies mucositis or sore throat Respiratory: Cough for the past 2 months Cardiovascular: Denies palpitation, chest discomfort Gastrointestinal:  Denies nausea, heartburn or change in bowel habits Skin: Denies abnormal skin rashes Lymphatics: Denies new lymphadenopathy or easy bruising Neurological:Denies numbness, tingling or new weaknesses Behavioral/Psych: Mood is stable, no new changes  Extremities: No lower extremity edema Breast: Left axillary discomfort  All other systems were reviewed with the patient and are negative.  I have reviewed the past medical history, past surgical history, social history and family history with the patient and they are unchanged from previous note.  ALLERGIES:  is allergic to augmentin and sulfa antibiotics.  MEDICATIONS:  Current Outpatient Prescriptions  Medication Sig Dispense Refill  . amitriptyline (ELAVIL) 50 MG tablet Take 50 mg by mouth at  bedtime.     . citalopram (CELEXA) 40 MG tablet Take 40 mg by mouth daily.     Marland Kitchen gabapentin (NEURONTIN) 400 MG capsule Take 800 mg by mouth 4 (four) times daily.    Marland Kitchen HYDROcodone-acetaminophen (NORCO) 7.5-325 MG per tablet     . HYDROcodone-acetaminophen (NORCO/VICODIN) 5-325 MG per tablet Take 1-2 tablets by mouth every 6 (six) hours as needed for pain. (Patient not taking: Reported on 03/16/2014) 20 tablet 1  . LORazepam (ATIVAN) 1 MG tablet Take 1 mg by mouth daily as needed for anxiety.     . raloxifene (EVISTA) 60 MG tablet TAKE 1 TABLET BY MOUTH EVERY DAY 30 tablet 9  . vitamin D, CHOLECALCIFEROL, 400 UNITS tablet Take 400 Units by mouth 2 (two) times daily.      No current facility-administered medications for this visit.    PHYSICAL EXAMINATION: ECOG PERFORMANCE STATUS: 1 - Symptomatic but completely ambulatory  Filed Vitals:   03/18/15 1011  BP: 94/56  Pulse: 82  Temp: 97.8 F (36.6 C)  Resp: 18   Filed Weights   03/18/15 1011  Weight: 109 lb 1.6 oz (49.487 kg)    GENERAL:alert, no distress and comfortable SKIN: skin color, texture, turgor are normal, no rashes or significant lesions EYES: normal, Conjunctiva are pink and non-injected, sclera clear OROPHARYNX:no exudate, no erythema and lips, buccal mucosa, and tongue normal  NECK: supple, thyroid normal size, non-tender, without nodularity LYMPH:  no palpable lymphadenopathy in the cervical, axillary or inguinal LUNGS: clear to auscultation and percussion with normal breathing effort HEART: regular rate & rhythm and no murmurs and no lower extremity edema ABDOMEN:abdomen soft, non-tender and normal bowel sounds MUSCULOSKELETAL:no cyanosis of digits and no clubbing  NEURO: alert & oriented x 3 with fluent speech, no  focal motor/sensory deficits EXTREMITIES: No lower extremity edema BREAST:No No palpable masses or nodules in either right or left breasts. No palpable axillary supraclavicular or infraclavicular adenopathy  no breast tenderness or nipple discharge. (exam performed in the presence of a chaperone)  LABORATORY DATA:  I have reviewed the data as listed   Chemistry      Component Value Date/Time   NA 143 03/08/2014 0804   NA 137 05/02/2012 0900   K 4.7 03/08/2014 0804   K 4.3 05/02/2012 0900   CL 102 05/24/2012 1223   CL 101 05/02/2012 0900   CO2 30* 03/08/2014 0804   CO2 30 05/02/2012 0900   BUN 14.0 03/08/2014 0804   BUN 20 05/02/2012 0900   CREATININE 0.8 03/08/2014 0804   CREATININE 0.77 05/02/2012 0900      Component Value Date/Time   CALCIUM 9.3 03/08/2014 0804   CALCIUM 9.5 05/02/2012 0900   ALKPHOS 60 03/08/2014 0804   ALKPHOS 49 02/21/2007 0839   AST 20 03/08/2014 0804   AST 18 02/21/2007 0839   ALT 8 03/08/2014 0804   ALT 13 02/21/2007 0839   BILITOT 0.40 03/08/2014 0804   BILITOT 0.4 02/21/2007 0839       Lab Results  Component Value Date   WBC 4.9 03/18/2015   HGB 15.7 03/18/2015   HCT 48.2* 03/18/2015   MCV 93.3 03/18/2015   PLT 223 03/18/2015   NEUTROABS 2.8 03/18/2015   ASSESSMENT & PLAN:  History of breast cancer in female 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 (stage unknown); Her chemotherapy was given by Dr. Burney Gauze, radiation by Dr. Shelby Dubin  Current treatment: Evista 60 mg daily  Breast cancer surveillance: 1. Breast exam 03/18/2014: The small palpable lymph node right axillary area and a small palpable nodule over the left scapula 2. Mammograms to be done next week. I will order ultrasound of the axilla to evaluate for lymph nodes.  Lung nodule follow-up: CT chest on 03/08/2014 revealed stable findings and they're unchanged from 06/10/2012 noncontrast CT scan will be ordered. If this scan is normal or unchanged, she will not need any further scans. Patient will call me after  the scan so that we can review the results of the scan on the phone.  Patient wishes to follow-up with her primary care physician for her breast cancer surveillance. I will see her on an as-needed basis.  No orders of the defined types were placed in this encounter.   The patient has a good understanding of the overall plan. she agrees with it. she will call with any problems that may develop before the next visit here.   Rulon Eisenmenger, MD 03/18/2015

## 2015-03-26 ENCOUNTER — Ambulatory Visit
Admission: RE | Admit: 2015-03-26 | Discharge: 2015-03-26 | Disposition: A | Discharge status: Home / Self Care | Payer: BC Managed Care – PPO | Source: Ambulatory Visit | Attending: Hematology and Oncology | Admitting: Hematology and Oncology

## 2015-03-26 DIAGNOSIS — R2231 Localized swelling, mass and lump, right upper limb: Secondary | ICD-10-CM

## 2015-03-26 DIAGNOSIS — Z853 Personal history of malignant neoplasm of breast: Secondary | ICD-10-CM

## 2015-03-28 ENCOUNTER — Ambulatory Visit (HOSPITAL_COMMUNITY)
Admission: RE | Admit: 2015-03-28 | Discharge: 2015-03-28 | Disposition: A | Discharge status: Home / Self Care | Payer: BC Managed Care – PPO | Source: Ambulatory Visit | Attending: Hematology and Oncology | Admitting: Hematology and Oncology

## 2015-03-28 ENCOUNTER — Encounter (HOSPITAL_COMMUNITY): Payer: Self-pay

## 2015-03-28 DIAGNOSIS — J439 Emphysema, unspecified: Secondary | ICD-10-CM | POA: Insufficient documentation

## 2015-03-28 DIAGNOSIS — N200 Calculus of kidney: Secondary | ICD-10-CM | POA: Insufficient documentation

## 2015-03-28 DIAGNOSIS — R911 Solitary pulmonary nodule: Secondary | ICD-10-CM | POA: Insufficient documentation

## 2015-03-28 DIAGNOSIS — R918 Other nonspecific abnormal finding of lung field: Secondary | ICD-10-CM

## 2015-03-29 ENCOUNTER — Telehealth: Payer: Self-pay | Admitting: *Deleted

## 2015-03-29 NOTE — Telephone Encounter (Signed)
TC from patient requesting results of yesterday's CT scan.  Informed pt that her scan was stable.  No other issues identified.

## 2016-02-01 ENCOUNTER — Ambulatory Visit (HOSPITAL_COMMUNITY)
Admission: EM | Admit: 2016-02-01 | Discharge: 2016-02-01 | Disposition: A | Discharge status: Home / Self Care | Payer: BC Managed Care – PPO | Attending: Family Medicine | Admitting: Family Medicine

## 2016-02-01 ENCOUNTER — Encounter (HOSPITAL_COMMUNITY): Payer: Self-pay | Admitting: Family Medicine

## 2016-02-01 DIAGNOSIS — L03012 Cellulitis of left finger: Secondary | ICD-10-CM | POA: Diagnosis not present

## 2016-02-01 MED ORDER — HYDROCODONE-ACETAMINOPHEN 5-325 MG PO TABS: 2.0000 | ORAL_TABLET | ORAL | 0 refills | Status: DC | PRN

## 2016-02-01 MED ORDER — DOXYCYCLINE HYCLATE 100 MG PO CAPS: 100.0000 mg | ORAL_CAPSULE | Freq: Two times a day (BID) | ORAL | 0 refills | Status: DC

## 2016-02-01 NOTE — ED Provider Notes (Signed)
CSN: [id number]     Arrival date & time 02/01/16  1219 History   First MD Initiated Contact with Patient 02/01/16 1418     Chief Complaint  Patient presents with  . Finger Injury   (Consider location/radiation/quality/duration/timing/severity/associated sxs/prior Treatment) Patient c/o redness swelling and abscess left thumb.   The history is provided by the patient.  Hand Pain  This is a new problem. The current episode started 2 days ago. The problem occurs constantly. The problem has not changed since onset.Nothing aggravates the symptoms. Nothing relieves the symptoms. She has tried nothing for the symptoms.    Past Medical History:  Diagnosis Date  . Anxiety    takes Ativan prn  . breast ca dx'd 02/1997   left  . Depression    takes Celexa daily  . Insomnia    takes Elavil nightly  . Neuromuscular disorder ([place])    nerve damage under left arm   Past Surgical History:  Procedure Laterality Date  . ABDOMINAL HYSTERECTOMY  1986  . BREAST BIOPSY Right 05/02/2012   Procedure: RIGHT BREAST WIRE GUIDED BIOPSY;  Surgeon: Rolm Bookbinder, MD;  Location: [facility];  Service: General;  Laterality: Right;  . BREAST LUMPECTOMY  1999   Left Breast   Family History  Problem Relation Age of Onset  . Cancer Father     lung Ca at age 26 yrs   Social History  Substance Use Topics  . Smoking status: Former Smoker    Quit date: 01/05/1997  . Smokeless tobacco: Never Used  . Alcohol use No   OB History    No data available     Review of Systems  Constitutional: Negative.   HENT: Negative.   Eyes: Negative.   Respiratory: Negative.   Cardiovascular: Negative.   Gastrointestinal: Negative.   Endocrine: Negative.   Genitourinary: Negative.   Musculoskeletal: Negative.   Skin: Positive for wound.  Allergic/Immunologic: Negative.   Neurological: Negative.   Hematological: Negative.   Psychiatric/Behavioral: Negative.     Allergies  Augmentin [amoxicillin-pot clavulanate]  and Sulfa antibiotics  Home Medications   Prior to Admission medications   Medication Sig Start Date End Date Taking? Authorizing Provider  doxycycline (VIBRAMYCIN) 100 MG capsule Take 1 capsule (100 mg total) by mouth 2 (two) times daily. 02/01/16   Lysbeth Penner, FNP  gabapentin (NEURONTIN) 400 MG capsule Take 800 mg by mouth 4 (four) times daily.    Historical Provider, MD  HYDROcodone-acetaminophen [facility]) 7.5-325 MG per tablet  03/15/14   Historical Provider, MD  HYDROcodone-acetaminophen (NORCO/VICODIN) 5-325 MG tablet Take 2 tablets by mouth every 4 (four) hours as needed. 02/01/16   Lysbeth Penner, FNP  raloxifene (EVISTA) 60 MG tablet TAKE 1 TABLET BY MOUTH EVERY DAY    Minette Headland, NP   Meds Ordered and Administered this Visit  Medications - No data to display  BP 120/67 (BP Location: Right Arm)   Pulse (!) 58   Temp 97.3 F (36.3 C) (Oral)   Resp 16   SpO2 100%  No data found.   Physical Exam  Constitutional: She appears well-developed and well-nourished.  HENT:  Head: Normocephalic and atraumatic.  Eyes: Conjunctivae and EOM are normal. Pupils are equal, round, and reactive to light.  Neck: Normal range of motion. Neck supple.  Cardiovascular: Normal rate, regular rhythm and normal heart sounds.   Pulmonary/Chest: Effort normal and breath sounds normal.  Skin:  Left thumb with paronychia and abscess in the left thumb tip.  Left thumb with yellow discoloration at thumb tip and surrounded by swelling and erythema.  Cuticle not swollen or tender.  Nursing note and vitals reviewed.   Urgent Care Course     .Marland KitchenIncision and Drainage Date/Time: 02/01/2016 3:07 PM Performed by: Lysbeth Penner Authorized by: Ihor Gully D   Consent:    Consent obtained:  Verbal   Consent given by:  Patient   Risks discussed:  Bleeding, pain, incomplete drainage and infection   Alternatives discussed:  No treatment Location:    Type:  Abscess   Size:  1 cm   Location:   Upper extremity   Upper extremity location:  Finger   Finger location:  L thumb Pre-procedure details:    Skin preparation:  Betadine Anesthesia (see MAR for exact dosages):    Anesthesia method:  Nerve block   Block needle gauge:  27 G   Block anesthetic:  Lidocaine 1% w/o epi   Block injection procedure:  Anatomic landmarks identified, introduced needle, incremental injection and negative aspiration for blood   Block outcome:  Anesthesia achieved Procedure type:    Complexity:  Simple Procedure details:    Needle aspiration: no     Incision types:  Stab incision   Incision depth:  Dermal   Scalpel blade:  11   Drainage:  Bloody and purulent   Drainage amount:  Scant   Wound treatment:  Wound left open   Packing materials:  None Comments:     4x4 placed over wound and taped secure with tape.   (including critical care time)  Labs Review Labs Reviewed - No data to display  Imaging Review No results found.   Visual Acuity Review  Right Eye Distance:   Left Eye Distance:   Bilateral Distance:    Right Eye Near:   Left Eye Near:    Bilateral Near:         MDM   1. Paronychia of left thumb    I&D left thumb abscess  Doxycycline 100mg  one po bid x 10 days #20 Norco 5/325 one po q 6 hours prn pain #6  Dressing change daily     Lysbeth Penner, FNP 02/01/16 1510

## 2016-02-01 NOTE — ED Triage Notes (Signed)
Pt here for redness and swelling to left thumb.

## 2016-04-17 ENCOUNTER — Other Ambulatory Visit: Payer: Self-pay | Admitting: Hematology and Oncology

## 2016-04-17 DIAGNOSIS — Z1231 Encounter for screening mammogram for malignant neoplasm of breast: Secondary | ICD-10-CM

## 2016-05-14 ENCOUNTER — Ambulatory Visit
Admission: RE | Admit: 2016-05-14 | Discharge: 2016-05-14 | Disposition: A | Discharge status: Home / Self Care | Payer: BC Managed Care – PPO | Source: Ambulatory Visit | Attending: Hematology and Oncology | Admitting: Hematology and Oncology

## 2016-05-14 DIAGNOSIS — Z1231 Encounter for screening mammogram for malignant neoplasm of breast: Secondary | ICD-10-CM

## 2016-05-14 HISTORY — DX: Personal history of irradiation: Z92.3

## 2016-05-14 HISTORY — DX: Personal history of antineoplastic chemotherapy: Z92.21

## 2017-06-17 ENCOUNTER — Other Ambulatory Visit: Payer: Self-pay | Admitting: Nurse Practitioner

## 2017-06-17 DIAGNOSIS — Z1231 Encounter for screening mammogram for malignant neoplasm of breast: Secondary | ICD-10-CM

## 2017-07-02 ENCOUNTER — Ambulatory Visit
Admission: RE | Admit: 2017-07-02 | Discharge: 2017-07-02 | Disposition: A | Discharge status: Home / Self Care | Payer: BC Managed Care – PPO | Source: Ambulatory Visit | Attending: Nurse Practitioner | Admitting: Nurse Practitioner

## 2017-07-02 DIAGNOSIS — Z1231 Encounter for screening mammogram for malignant neoplasm of breast: Secondary | ICD-10-CM

## 2018-01-17 ENCOUNTER — Other Ambulatory Visit (HOSPITAL_COMMUNITY): Payer: Self-pay | Admitting: *Deleted

## 2018-02-17 NOTE — Progress Notes (Signed)
error 

## 2018-02-25 ENCOUNTER — Ambulatory Visit
Admission: RE | Admit: 2018-02-25 | Discharge: 2018-02-25 | Disposition: A | Discharge status: Home / Self Care | Payer: Medicare Other | Source: Ambulatory Visit | Attending: Radiation Oncology | Admitting: Radiation Oncology

## 2018-02-25 ENCOUNTER — Ambulatory Visit: Payer: Medicare Other

## 2018-03-04 NOTE — Progress Notes (Signed)
Histology and Location of Primary Skin Cancer:    Candace Howe presented with the following signs/symptoms months ago: She presented to her dermatologist with reports of rough skin, bumpy, inflamed, reddened and crusting to her left shin (an area that had previously been excised).   Past/Anticipated interventions by patient's surgeon/dermatologist for current problematic lesion, if any:  Dr. Jarome Matin performed biopsies on 01/31/18 and referred her to Radiation Oncology.   Past skin cancers, if any:   History of Blistering sunburns, if any: Yes, many years ago.   SAFETY ISSUES:  Prior radiation?Yes, 1999 left breast, Dr. Shelby Dubin  Pacemaker/ICD? No  Possible current pregnancy? No  Is the patient on methotrexate? 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 (stage unknown); Her chemotherapy was given by Dr. Burney Gauze, radiation by Dr. Shelby Dubin  BP 122/75   Pulse 62   Temp 97.9 F (36.6 C) (Oral)   Ht 5\' 3"  (1.6 m)   Wt 104 lb 9.6 oz (47.4 kg)   SpO2 94% Comment: room air  BMI 18.53 kg/m    Wt Readings from Last 3 Encounters:  03/11/18 104 lb 9.6 oz (47.4 kg)  03/18/15 109 lb 1.6 oz (49.5 kg)  03/16/14 110 lb 8 oz (50.1 kg)

## 2018-03-11 ENCOUNTER — Encounter: Payer: Self-pay | Admitting: Radiation Oncology

## 2018-03-11 ENCOUNTER — Other Ambulatory Visit: Payer: Self-pay

## 2018-03-11 ENCOUNTER — Ambulatory Visit
Admission: RE | Admit: 2018-03-11 | Discharge: 2018-03-11 | Disposition: A | Discharge status: Home / Self Care | Payer: Medicare Other | Source: Ambulatory Visit | Attending: Radiation Oncology | Admitting: Radiation Oncology

## 2018-03-11 ENCOUNTER — Other Ambulatory Visit: Payer: Self-pay | Admitting: Radiation Oncology

## 2018-03-11 VITALS — BP 122/75 | HR 62 | Temp 97.9°F | Ht 63.0 in | Wt 104.6 lb

## 2018-03-11 DIAGNOSIS — G4700 Insomnia, unspecified: Secondary | ICD-10-CM | POA: Diagnosis not present

## 2018-03-11 DIAGNOSIS — Z809 Family history of malignant neoplasm, unspecified: Secondary | ICD-10-CM | POA: Insufficient documentation

## 2018-03-11 DIAGNOSIS — Z923 Personal history of irradiation: Secondary | ICD-10-CM | POA: Diagnosis not present

## 2018-03-11 DIAGNOSIS — F329 Major depressive disorder, single episode, unspecified: Secondary | ICD-10-CM | POA: Insufficient documentation

## 2018-03-11 DIAGNOSIS — Z79899 Other long term (current) drug therapy: Secondary | ICD-10-CM | POA: Diagnosis not present

## 2018-03-11 DIAGNOSIS — C44729 Squamous cell carcinoma of skin of left lower limb, including hip: Secondary | ICD-10-CM

## 2018-03-11 DIAGNOSIS — Z9221 Personal history of antineoplastic chemotherapy: Secondary | ICD-10-CM | POA: Insufficient documentation

## 2018-03-11 DIAGNOSIS — Z87891 Personal history of nicotine dependence: Secondary | ICD-10-CM | POA: Diagnosis not present

## 2018-03-11 DIAGNOSIS — C4492 Squamous cell carcinoma of skin, unspecified: Secondary | ICD-10-CM

## 2018-03-11 HISTORY — DX: Squamous cell carcinoma of skin, unspecified: C44.92

## 2018-03-11 HISTORY — DX: Malignant neoplasm of unspecified site of unspecified female breast: C50.919

## 2018-03-11 HISTORY — DX: Basal cell carcinoma of skin, unspecified: C44.91

## 2018-03-11 NOTE — Progress Notes (Signed)
Radiation Oncology         [phone] ________________________________  Initial outpatient Consultation  Name: Candace Howe MRN: [id number]  Date: 03/11/2018  DOB: May 02, 1950  CC:Lam, Rudi Rummage, NP  Jarome Matin, MD   REFERRING PHYSICIAN: Jarome Matin, MD  DIAGNOSIS:    ICD-10-CM   1. 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 female who presented is an established patient of [facility] with an extensive history of basal cell carcinoma and squamous cell carcinoma skin cancers since 07/2011. She presented to her dermatologist on 01/14/2018 with a lesion on her left shin located on top of an area that was previously excised, which she reported as bumpy, inflamed, reddened, and crusting. She states the previous area was large and ended up becoming infected. She proceeded to shave biopsy that day under Dr. Ronnald Ramp. 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  She reports doing well overall and denies any symptoms. She notes recent weight loss caused by family stress. 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   PREVIOUS RADIATION THERAPY: Yes  1999: Left Breast, treated with lumpectomy with axillary lymph node dissection followed by chemotherapy (Dr. Marin Olp), radiation therapy (Dr. Elba Barman), and 5 years of tamoxifen.  PAST MEDICAL HISTORY:  has a past medical history of Anxiety, Basal cell carcinoma, Breast cancer ([place]) (02/1997), Depression, Insomnia, Neuromuscular disorder ([place]), Personal history of chemotherapy (1999), Personal history of radiation therapy  (1999), and Squamous cell skin cancer.    PAST SURGICAL HISTORY: Past Surgical History:  Procedure Laterality Date  . ABDOMINAL HYSTERECTOMY  1986  . BREAST BIOPSY Right 05/02/2012   Procedure: RIGHT BREAST WIRE GUIDED BIOPSY;  Surgeon: Rolm Bookbinder, MD;  Location: [facility];  Service: General;  Laterality: Right;  . BREAST LUMPECTOMY  1999   Left Breast    FAMILY HISTORY: family history includes Cancer in her father.  SOCIAL HISTORY:  reports that she quit smoking about 21 years ago. She has never used smokeless tobacco. She reports that she does not drink alcohol or use drugs.  ALLERGIES: Augmentin [amoxicillin-pot clavulanate] and Sulfa antibiotics  MEDICATIONS:  Current Outpatient Medications  Medication Sig Dispense Refill  . gabapentin (NEURONTIN) 400 MG capsule Take 800 mg by mouth 4 (four) times daily.    Marland Kitchen HYDROcodone-acetaminophen (NORCO/VICODIN) 5-325 MG tablet Take 2 tablets by mouth every 4 (four) hours as needed. 6 tablet 0  . raloxifene (EVISTA) 60 MG tablet Take 60 mg by mouth daily.    Marland Kitchen triamcinolone ointment (KENALOG) 0.1 % APPLY TO AFFECTED AREA(S) ON SKIN TWICE DAILY     No current facility-administered medications for this encounter.     REVIEW OF SYSTEMS:  Notable for that above.   PHYSICAL EXAM:  height is 5\' 3"  (1.6 m) and weight is 104 lb 9.6 oz (47.4 kg). Her oral temperature is 97.9 F (36.6 C). Her blood pressure is 122/75 and her pulse is 62. Her oxygen saturation is 94%.   General: Alert and oriented, in no acute distress Skin: Left lateral pre-tibial region of the left lower leg, she has  two erythematous nodular areas and some surrounding hyperpigmentation in the region where she previously had excisional surgery and subsequent infection. Vasc: pedal pulse intact, left foot Psychiatric: Judgment and insight are intact. Affect is appropriate.   ECOG = 0  0 - Asymptomatic (Fully active, able to carry on all predisease activities without  restriction)  1 - Symptomatic but completely ambulatory (Restricted in physically strenuous activity but ambulatory and able to carry out work of a light or sedentary nature. For example, light housework, office work)  2 - Symptomatic, <50% in bed during the day (Ambulatory and capable of all self care but unable to carry out any work activities. Up and about more than 50% of waking hours)  3 - Symptomatic, >50% in bed, but not bedbound (Capable of only limited self-care, confined to bed or chair 50% or more of waking hours)  4 - Bedbound (Completely disabled. Cannot carry on any self-care. Totally confined to bed or chair)  5 - Death   Eustace Pen MM, Creech RH, Tormey DC, et al. [phone]). "Toxicity and response criteria of the [facility]". [employer] Oncol. 5 (6): 649-55   LABORATORY DATA:  Lab Results  Component Value Date   WBC 4.9 03/18/2015   HGB 15.7 03/18/2015   HCT 48.2 (H) 03/18/2015   MCV 93.3 03/18/2015   PLT 223 03/18/2015   CMP     Component Value Date/Time   NA 143 03/18/2015 1002   K 4.3 03/18/2015 1002   CL 102 05/24/2012 1223   CO2 31 (H) 03/18/2015 1002   GLUCOSE 108 03/18/2015 1002   GLUCOSE 80 05/24/2012 1223   BUN 19.3 03/18/2015 1002   CREATININE 0.8 03/18/2015 1002   CALCIUM 8.9 03/18/2015 1002   PROT 6.5 03/18/2015 1002   ALBUMIN 3.5 03/18/2015 1002   AST 16 03/18/2015 1002   ALT 10 03/18/2015 1002   ALKPHOS 53 03/18/2015 1002   BILITOT 0.37 03/18/2015 1002   GFRNONAA 89 (L) 05/02/2012 0900   GFRAA >90 05/02/2012 0900         RADIOGRAPHY: No results found.    IMPRESSION/PLAN: Squamous Cell Carcinoma of Skin of Anterior Tibia   If radiation were to be given, anticipate 6 weeks of radiation therapy.  There would be risks associated with radiotherapy including the risk of a non-healing wound.  It was a pleasure meeting the patient today. We discussed the risks, benefits, and side effects of radiotherapy. No guarantees of  treatment were given. A consent form was signed and placed in the patient's medical record.    Social work will be consulted for distress / family stressors expressed today.  After consult, I spoke to Dr. Jarome Matin to discuss this patient.  I explained the risks of radiotherapy.  I explained that radiotherapy would need to be given over 6 weeks.  As he and I weighed the risks and benefits of radiotherapy versus surgery, it seemed to both of [place] that surgery would be the best option for this patient.  I imagine the patient will be more enthusiastic about surgery now that she realizes that radiotherapy would not be terribly convenient.  He will call the patient to set up her surgical care. I will see her PRN.   I spent 40 minutes  face to face with the patient and more than 50% of that time was spent in counseling and/or coordination of care.    __________________________________________   Eppie Gibson, MD  This document serves as a record  of services personally performed by Eppie Gibson, MD. It was created on her behalf by Wilburn Mylar, a trained medical scribe. The creation of this record is based on the scribe's personal observations and the provider's statements to them. This document has been checked and approved by the attending provider.

## 2018-03-16 ENCOUNTER — Encounter: Payer: Self-pay | Admitting: General Practice

## 2018-03-16 NOTE — Progress Notes (Signed)
[facility] Psychosocial Distress Screening Clinical Social Work  Clinical Social Work was referred by distress screening protocol.  The patient scored a 10 on the Psychosocial Distress Thermometer which indicates severe distress. Clinical Social Worker contacted patient by phone to assess for distress and other psychosocial needs. Called patient who stated her major source of stress was not cancer or radiation but family member who had made "bad choices and needs to pay some consequences."  States "things are much better now, I am not so stressed out about things."  Was somewhat surprised about number of radiation treatments needed, "but I've done it before and know what Im facing."  Would like to continue to work part time job while in treatment, advised to let scheduler know about her work hours.  Briefly discussed [place] and how to access services.    ONCBCN DISTRESS SCREENING 03/11/2018  Screening Type Initial Screening  Distress experienced in past week (1-10) 10  Family Problem type Children  Emotional problem type Nervousness/Anxiety  Physical Problem type Sleep/insomnia    Clinical Social Worker follow up needed: No.  If yes, follow up plan:  Beverely Pace, [place], LCSW Clinical Social Worker Phone:  [phone]

## 2018-08-11 ENCOUNTER — Other Ambulatory Visit: Payer: Self-pay | Admitting: Nurse Practitioner

## 2018-08-11 DIAGNOSIS — Z1231 Encounter for screening mammogram for malignant neoplasm of breast: Secondary | ICD-10-CM

## 2018-09-29 ENCOUNTER — Other Ambulatory Visit: Payer: Self-pay

## 2018-09-29 ENCOUNTER — Ambulatory Visit
Admission: RE | Admit: 2018-09-29 | Discharge: 2018-09-29 | Disposition: A | Discharge status: Home / Self Care | Payer: Medicare Other | Source: Ambulatory Visit | Attending: Nurse Practitioner | Admitting: Nurse Practitioner

## 2018-09-29 DIAGNOSIS — Z1231 Encounter for screening mammogram for malignant neoplasm of breast: Secondary | ICD-10-CM

## 2019-05-04 DIAGNOSIS — L821 Other seborrheic keratosis: Secondary | ICD-10-CM | POA: Diagnosis not present

## 2019-05-04 DIAGNOSIS — M6740 Ganglion, unspecified site: Secondary | ICD-10-CM | POA: Diagnosis not present

## 2019-05-04 DIAGNOSIS — C44612 Basal cell carcinoma of skin of right upper limb, including shoulder: Secondary | ICD-10-CM | POA: Diagnosis not present

## 2019-05-04 DIAGNOSIS — D485 Neoplasm of uncertain behavior of skin: Secondary | ICD-10-CM | POA: Diagnosis not present

## 2019-05-04 DIAGNOSIS — C44712 Basal cell carcinoma of skin of right lower limb, including hip: Secondary | ICD-10-CM | POA: Diagnosis not present

## 2019-05-04 DIAGNOSIS — C44519 Basal cell carcinoma of skin of other part of trunk: Secondary | ICD-10-CM | POA: Diagnosis not present

## 2019-05-04 DIAGNOSIS — Z85828 Personal history of other malignant neoplasm of skin: Secondary | ICD-10-CM | POA: Diagnosis not present

## 2019-05-04 DIAGNOSIS — L570 Actinic keratosis: Secondary | ICD-10-CM | POA: Diagnosis not present

## 2019-05-04 DIAGNOSIS — L820 Inflamed seborrheic keratosis: Secondary | ICD-10-CM | POA: Diagnosis not present

## 2019-05-18 DIAGNOSIS — M5416 Radiculopathy, lumbar region: Secondary | ICD-10-CM | POA: Diagnosis not present

## 2019-05-18 DIAGNOSIS — R599 Enlarged lymph nodes, unspecified: Secondary | ICD-10-CM | POA: Diagnosis not present

## 2019-05-18 DIAGNOSIS — K439 Ventral hernia without obstruction or gangrene: Secondary | ICD-10-CM | POA: Diagnosis not present

## 2019-05-18 DIAGNOSIS — M792 Neuralgia and neuritis, unspecified: Secondary | ICD-10-CM | POA: Diagnosis not present

## 2019-05-18 DIAGNOSIS — F1190 Opioid use, unspecified, uncomplicated: Secondary | ICD-10-CM | POA: Diagnosis not present

## 2019-05-18 DIAGNOSIS — J439 Emphysema, unspecified: Secondary | ICD-10-CM | POA: Diagnosis not present

## 2019-05-18 DIAGNOSIS — Z681 Body mass index (BMI) 19 or less, adult: Secondary | ICD-10-CM | POA: Diagnosis not present

## 2019-05-18 DIAGNOSIS — Z79899 Other long term (current) drug therapy: Secondary | ICD-10-CM | POA: Diagnosis not present

## 2019-08-03 DIAGNOSIS — L720 Epidermal cyst: Secondary | ICD-10-CM | POA: Diagnosis not present

## 2019-08-03 DIAGNOSIS — Z681 Body mass index (BMI) 19 or less, adult: Secondary | ICD-10-CM | POA: Diagnosis not present

## 2019-08-17 DIAGNOSIS — M792 Neuralgia and neuritis, unspecified: Secondary | ICD-10-CM | POA: Diagnosis not present

## 2019-08-17 DIAGNOSIS — M5416 Radiculopathy, lumbar region: Secondary | ICD-10-CM | POA: Diagnosis not present

## 2019-08-17 DIAGNOSIS — Z681 Body mass index (BMI) 19 or less, adult: Secondary | ICD-10-CM | POA: Diagnosis not present

## 2019-08-17 DIAGNOSIS — F1190 Opioid use, unspecified, uncomplicated: Secondary | ICD-10-CM | POA: Diagnosis not present

## 2019-08-17 DIAGNOSIS — Z853 Personal history of malignant neoplasm of breast: Secondary | ICD-10-CM | POA: Diagnosis not present

## 2019-08-17 DIAGNOSIS — K439 Ventral hernia without obstruction or gangrene: Secondary | ICD-10-CM | POA: Diagnosis not present

## 2019-08-17 DIAGNOSIS — Z79899 Other long term (current) drug therapy: Secondary | ICD-10-CM | POA: Diagnosis not present

## 2019-08-17 DIAGNOSIS — M8580 Other specified disorders of bone density and structure, unspecified site: Secondary | ICD-10-CM | POA: Diagnosis not present

## 2019-10-02 ENCOUNTER — Other Ambulatory Visit: Payer: Self-pay | Admitting: Nurse Practitioner

## 2019-10-02 DIAGNOSIS — Z1231 Encounter for screening mammogram for malignant neoplasm of breast: Secondary | ICD-10-CM

## 2019-10-19 ENCOUNTER — Ambulatory Visit
Admission: RE | Admit: 2019-10-19 | Discharge: 2019-10-19 | Disposition: A | Discharge status: Home / Self Care | Payer: Medicare PPO | Source: Ambulatory Visit | Attending: Nurse Practitioner | Admitting: Nurse Practitioner

## 2019-10-19 ENCOUNTER — Other Ambulatory Visit: Payer: Self-pay

## 2019-10-19 DIAGNOSIS — Z1231 Encounter for screening mammogram for malignant neoplasm of breast: Secondary | ICD-10-CM

## 2019-11-23 DIAGNOSIS — M792 Neuralgia and neuritis, unspecified: Secondary | ICD-10-CM | POA: Diagnosis not present

## 2019-11-23 DIAGNOSIS — Z853 Personal history of malignant neoplasm of breast: Secondary | ICD-10-CM | POA: Diagnosis not present

## 2019-11-23 DIAGNOSIS — M5416 Radiculopathy, lumbar region: Secondary | ICD-10-CM | POA: Diagnosis not present

## 2019-11-23 DIAGNOSIS — Z79899 Other long term (current) drug therapy: Secondary | ICD-10-CM | POA: Diagnosis not present

## 2019-11-23 DIAGNOSIS — Z23 Encounter for immunization: Secondary | ICD-10-CM | POA: Diagnosis not present

## 2019-11-23 DIAGNOSIS — M8580 Other specified disorders of bone density and structure, unspecified site: Secondary | ICD-10-CM | POA: Diagnosis not present

## 2019-11-23 DIAGNOSIS — F1190 Opioid use, unspecified, uncomplicated: Secondary | ICD-10-CM | POA: Diagnosis not present

## 2020-02-22 DIAGNOSIS — Z9181 History of falling: Secondary | ICD-10-CM | POA: Diagnosis not present

## 2020-02-22 DIAGNOSIS — Z1331 Encounter for screening for depression: Secondary | ICD-10-CM | POA: Diagnosis not present

## 2020-02-22 DIAGNOSIS — Z0000 Encounter for general adult medical examination without abnormal findings: Secondary | ICD-10-CM | POA: Diagnosis not present

## 2020-02-22 DIAGNOSIS — Z139 Encounter for screening, unspecified: Secondary | ICD-10-CM | POA: Diagnosis not present

## 2020-02-23 DIAGNOSIS — Z0000 Encounter for general adult medical examination without abnormal findings: Secondary | ICD-10-CM | POA: Diagnosis not present

## 2020-02-23 DIAGNOSIS — Z1331 Encounter for screening for depression: Secondary | ICD-10-CM | POA: Diagnosis not present

## 2020-02-23 DIAGNOSIS — Z139 Encounter for screening, unspecified: Secondary | ICD-10-CM | POA: Diagnosis not present

## 2020-02-23 DIAGNOSIS — Z23 Encounter for immunization: Secondary | ICD-10-CM | POA: Diagnosis not present

## 2020-02-23 DIAGNOSIS — M5416 Radiculopathy, lumbar region: Secondary | ICD-10-CM | POA: Diagnosis not present

## 2020-02-23 DIAGNOSIS — J439 Emphysema, unspecified: Secondary | ICD-10-CM | POA: Diagnosis not present

## 2020-02-23 DIAGNOSIS — Z79899 Other long term (current) drug therapy: Secondary | ICD-10-CM | POA: Diagnosis not present

## 2020-02-23 DIAGNOSIS — F1190 Opioid use, unspecified, uncomplicated: Secondary | ICD-10-CM | POA: Diagnosis not present

## 2020-02-23 DIAGNOSIS — M792 Neuralgia and neuritis, unspecified: Secondary | ICD-10-CM | POA: Diagnosis not present

## 2020-05-30 DIAGNOSIS — Z79899 Other long term (current) drug therapy: Secondary | ICD-10-CM | POA: Diagnosis not present

## 2020-05-30 DIAGNOSIS — Z853 Personal history of malignant neoplasm of breast: Secondary | ICD-10-CM | POA: Diagnosis not present

## 2020-05-30 DIAGNOSIS — Z681 Body mass index (BMI) 19 or less, adult: Secondary | ICD-10-CM | POA: Diagnosis not present

## 2020-05-30 DIAGNOSIS — M5416 Radiculopathy, lumbar region: Secondary | ICD-10-CM | POA: Diagnosis not present

## 2020-05-30 DIAGNOSIS — J439 Emphysema, unspecified: Secondary | ICD-10-CM | POA: Diagnosis not present

## 2020-05-30 DIAGNOSIS — M792 Neuralgia and neuritis, unspecified: Secondary | ICD-10-CM | POA: Diagnosis not present

## 2020-07-19 DIAGNOSIS — D122 Benign neoplasm of ascending colon: Secondary | ICD-10-CM | POA: Diagnosis not present

## 2020-07-19 DIAGNOSIS — Z1211 Encounter for screening for malignant neoplasm of colon: Secondary | ICD-10-CM | POA: Diagnosis not present

## 2020-07-19 DIAGNOSIS — K621 Rectal polyp: Secondary | ICD-10-CM | POA: Diagnosis not present

## 2020-07-23 DIAGNOSIS — K621 Rectal polyp: Secondary | ICD-10-CM | POA: Diagnosis not present

## 2020-07-23 DIAGNOSIS — D122 Benign neoplasm of ascending colon: Secondary | ICD-10-CM | POA: Diagnosis not present

## 2020-07-26 ENCOUNTER — Other Ambulatory Visit: Payer: Self-pay

## 2020-07-26 ENCOUNTER — Ambulatory Visit
Admission: EM | Admit: 2020-07-26 | Discharge: 2020-07-26 | Disposition: A | Discharge status: Home / Self Care | Payer: Medicare Other | Attending: Student | Admitting: Student

## 2020-07-26 ENCOUNTER — Encounter: Payer: Self-pay | Admitting: Emergency Medicine

## 2020-07-26 DIAGNOSIS — L237 Allergic contact dermatitis due to plants, except food: Secondary | ICD-10-CM | POA: Diagnosis not present

## 2020-07-26 MED ORDER — PREDNISONE 10 MG (21) PO TBPK: ORAL_TABLET | Freq: Every day | ORAL | 0 refills | Status: DC

## 2020-07-26 NOTE — ED Provider Notes (Signed)
[facility]    CSN: [id number] Arrival date & time: 07/26/20  [id number]      History   Chief Complaint Chief Complaint  Patient presents with   Rash    HPI Candace Howe is a 70 y.o. female presenting with itchy rash.  Medical history depression, neuromuscular disorder, breast cancer, anxiety.  States she was gardening 3 days ago, following this developed an itchy rash first on the lower extremities and now on the upper extremities, abdomen, back.  Denies facial rash, shortness of breath, wheezing, dizziness, nausea.  Has tried multiple over-the-counter medications like Benadryl and cortisone with minimal improvement.  HPI  Past Medical History:  Diagnosis Date   Anxiety    takes Ativan prn   Basal cell carcinoma    Breast cancer ([place]) 02/1997   lumpectomy w/ alnd, chemo, rt, and tamoxifen   Depression    takes Celexa daily   Insomnia    takes Elavil nightly   Neuromuscular disorder ([place])    nerve damage under left arm   Personal history of chemotherapy 1999   Left Breast Cancer   Personal history of radiation therapy 1999   Left Breast Cancer   Squamous cell skin cancer     Patient Active Problem List   Diagnosis Date Noted   Squamous cell carcinoma, leg, left 03/11/2018   Lung nodules 03/18/2015   History of breast cancer in female 04/05/2012    Past Surgical History:  Procedure Laterality Date   ABDOMINAL HYSTERECTOMY  1986   BREAST BIOPSY Right 05/02/2012   Procedure: RIGHT BREAST WIRE GUIDED BIOPSY;  Surgeon: Rolm Bookbinder, MD;  Location: [facility];  Service: General;  Laterality: Right;   BREAST LUMPECTOMY Left 1999    OB History   No obstetric history on file.      Home Medications    Prior to Admission medications   Medication Sig Start Date End Date Taking? Authorizing Provider  predniSONE (STERAPRED UNI-PAK 21 TAB) 10 MG (21) TBPK tablet Take by mouth daily. Take 6 tabs by mouth daily  for 2 days, then 5 tabs for 2 days,  then 4 tabs for 2 days, then 3 tabs for 2 days, 2 tabs for 2 days, then 1 tab by mouth daily for 2 days 07/26/20  Yes Phillip Heal, Sherlon Handing, PA-C  gabapentin (NEURONTIN) 400 MG capsule Take 800 mg by mouth 4 (four) times daily.    [provider]  HYDROcodone-acetaminophen (NORCO/VICODIN) 5-325 MG tablet Take 2 tablets by mouth every 4 (four) hours as needed. 02/01/16   Lysbeth Penner, FNP  raloxifene (EVISTA) 60 MG tablet Take 60 mg by mouth daily.    [provider]  triamcinolone ointment (KENALOG) 0.1 % APPLY TO AFFECTED AREA(S) ON SKIN TWICE DAILY 01/14/18   [provider]    Family History Family History  Problem Relation Age of Onset   Cancer Father        lung Ca at age 32 yrs   Breast cancer Neg Hx     Social History Social History   Tobacco Use   Smoking status: Former    Types: Cigarettes    Quit date: 01/05/1997    Years since quitting: 23.5   Smokeless tobacco: Never  Vaping Use   Vaping Use: Never used  Substance Use Topics   Alcohol use: No   Drug use: No     Allergies   Augmentin [amoxicillin-pot clavulanate] and Sulfa antibiotics   Review of Systems Review of  Systems  Skin:  Positive for rash.  All other systems reviewed and are negative.   Physical Exam Triage Vital Signs ED Triage Vitals [07/26/20 0934]  Enc Vitals Group     BP      Pulse Rate 78     Resp 18     Temp 98 F (36.7 C)     Temp src      SpO2 (!) 89 %     Weight      Height      Head Circumference      Peak Flow      Pain Score 4     Pain Loc      Pain Edu?      Excl. in [place]?    No data found.  Updated Vital Signs Pulse 78   Temp 98 F (36.7 C)   Resp 18   SpO2 (!) 89%   Visual Acuity Right Eye Distance:   Left Eye Distance:   Bilateral Distance:    Right Eye Near:   Left Eye Near:    Bilateral Near:     Physical Exam Vitals reviewed.  Constitutional:      General: She is not in acute distress.    Appearance: Normal appearance. She is  not ill-appearing or diaphoretic.  HENT:     Head: Normocephalic and atraumatic.  Cardiovascular:     Rate and Rhythm: Normal rate and regular rhythm.     Heart sounds: Normal heart sounds.  Pulmonary:     Effort: Pulmonary effort is normal.     Breath sounds: Normal breath sounds.  Skin:    General: Skin is warm.     Comments: Scattered vesicular rash on Les, Ues, back, abd. No rash on face. No facial, pharyngeal, lip, tongue, uvula swelling.  Neurological:     General: No focal deficit present.     Mental Status: She is alert and oriented to person, place, and time.  Psychiatric:        Mood and Affect: Mood normal.        Behavior: Behavior normal.        Thought Content: Thought content normal.        Judgment: Judgment normal.     UC Treatments / Results  Labs (all labs ordered are listed, but only abnormal results are displayed) Labs Reviewed - No data to display  EKG   Radiology No results found.  Procedures Procedures (including critical care time)  Medications Ordered in UC Medications - No data to display  Initial Impression / Assessment and Plan / UC Course  I have reviewed the triage vital signs and the nursing notes.  Pertinent labs & imaging results that were available during my care of the patient were reviewed by me and considered in my medical decision making (see chart for details).     This patient is a very pleasant 70 y.o. year old female presenting with contact dermatitis.   O2 ranging 89-92% on room air, no new shortness of breath or chest pain. Patient states she "probably has COPD and my oxygen is usually like this."  Prednisone, benedryl.  ED return precautions discussed. Patient verbalizes understanding and agreement.   Final Clinical Impressions(s) / UC Diagnoses   Final diagnoses:  Allergic contact dermatitis due to plants, except food     Discharge Instructions      -Prednisone taper for contact dermatitis. I recommend  taking this in the morning as it could give you energy.  Avoid NSAIDs like ibuprofen and alleve while taking this medication as they can increase your risk of stomach upset and even GI bleeding when in combination with a steroid. You can continue tylenol (acetaminophen) up to '1000mg'$  3x daily. -Benedryl for symptomatic relief, this can cause drowsiness. -Seek additional medical attention if symptoms worsen instead of improve, like worsening of the rash, or new symptoms like facial swelling, lip swelling, tongue swelling, shortness of breath.      ED Prescriptions     Medication Sig Dispense Auth. Provider   predniSONE (STERAPRED UNI-PAK 21 TAB) 10 MG (21) TBPK tablet Take by mouth daily. Take 6 tabs by mouth daily  for 2 days, then 5 tabs for 2 days, then 4 tabs for 2 days, then 3 tabs for 2 days, 2 tabs for 2 days, then 1 tab by mouth daily for 2 days 42 tablet Hazel Sams, PA-C      PDMP not reviewed this encounter.   Hazel Sams, PA-C 07/26/20 1037

## 2020-07-26 NOTE — Discharge Instructions (Addendum)
-  Prednisone taper for contact dermatitis. I recommend taking this in the morning as it could give you energy.  Avoid NSAIDs like ibuprofen and alleve while taking this medication as they can increase your risk of stomach upset and even GI bleeding when in combination with a steroid. You can continue tylenol (acetaminophen) up to '1000mg'$  3x daily. -Benedryl for symptomatic relief, this can cause drowsiness. -Seek additional medical attention if symptoms worsen instead of improve, like worsening of the rash, or new symptoms like facial swelling, lip swelling, tongue swelling, shortness of breath.

## 2020-07-26 NOTE — ED Triage Notes (Signed)
Pt was gardening on Sunday and by Tuesday had a rash over her entire body that is itchy and burning. Has tried OTC cream with no success.

## 2020-10-14 ENCOUNTER — Other Ambulatory Visit: Payer: Self-pay | Admitting: Nurse Practitioner

## 2020-10-14 DIAGNOSIS — Z1231 Encounter for screening mammogram for malignant neoplasm of breast: Secondary | ICD-10-CM

## 2020-11-15 ENCOUNTER — Ambulatory Visit
Admission: RE | Admit: 2020-11-15 | Discharge: 2020-11-15 | Disposition: A | Discharge status: Home / Self Care | Payer: Medicare Other | Source: Ambulatory Visit | Attending: Nurse Practitioner | Admitting: Nurse Practitioner

## 2020-11-15 ENCOUNTER — Other Ambulatory Visit: Payer: Self-pay

## 2020-11-15 DIAGNOSIS — Z1231 Encounter for screening mammogram for malignant neoplasm of breast: Secondary | ICD-10-CM | POA: Diagnosis not present

## 2020-12-05 DIAGNOSIS — L570 Actinic keratosis: Secondary | ICD-10-CM | POA: Diagnosis not present

## 2020-12-05 DIAGNOSIS — B078 Other viral warts: Secondary | ICD-10-CM | POA: Diagnosis not present

## 2020-12-05 DIAGNOSIS — C44619 Basal cell carcinoma of skin of left upper limb, including shoulder: Secondary | ICD-10-CM | POA: Diagnosis not present

## 2020-12-05 DIAGNOSIS — C44529 Squamous cell carcinoma of skin of other part of trunk: Secondary | ICD-10-CM | POA: Diagnosis not present

## 2020-12-05 DIAGNOSIS — C44729 Squamous cell carcinoma of skin of left lower limb, including hip: Secondary | ICD-10-CM | POA: Diagnosis not present

## 2020-12-05 DIAGNOSIS — L720 Epidermal cyst: Secondary | ICD-10-CM | POA: Diagnosis not present

## 2021-01-30 DIAGNOSIS — J439 Emphysema, unspecified: Secondary | ICD-10-CM | POA: Diagnosis not present

## 2021-01-30 DIAGNOSIS — M792 Neuralgia and neuritis, unspecified: Secondary | ICD-10-CM | POA: Diagnosis not present

## 2021-01-30 DIAGNOSIS — E7800 Pure hypercholesterolemia, unspecified: Secondary | ICD-10-CM | POA: Diagnosis not present

## 2021-01-30 DIAGNOSIS — M8580 Other specified disorders of bone density and structure, unspecified site: Secondary | ICD-10-CM | POA: Diagnosis not present

## 2021-03-06 DIAGNOSIS — Z1331 Encounter for screening for depression: Secondary | ICD-10-CM | POA: Diagnosis not present

## 2021-03-06 DIAGNOSIS — Z0000 Encounter for general adult medical examination without abnormal findings: Secondary | ICD-10-CM | POA: Diagnosis not present

## 2021-03-06 DIAGNOSIS — Z139 Encounter for screening, unspecified: Secondary | ICD-10-CM | POA: Diagnosis not present

## 2021-03-06 DIAGNOSIS — E785 Hyperlipidemia, unspecified: Secondary | ICD-10-CM | POA: Diagnosis not present

## 2021-03-06 DIAGNOSIS — Z9181 History of falling: Secondary | ICD-10-CM | POA: Diagnosis not present

## 2021-06-05 DIAGNOSIS — C44612 Basal cell carcinoma of skin of right upper limb, including shoulder: Secondary | ICD-10-CM | POA: Diagnosis not present

## 2021-06-05 DIAGNOSIS — D045 Carcinoma in situ of skin of trunk: Secondary | ICD-10-CM | POA: Diagnosis not present

## 2021-06-05 DIAGNOSIS — D485 Neoplasm of uncertain behavior of skin: Secondary | ICD-10-CM | POA: Diagnosis not present

## 2021-06-05 DIAGNOSIS — L812 Freckles: Secondary | ICD-10-CM | POA: Diagnosis not present

## 2021-06-05 DIAGNOSIS — C44619 Basal cell carcinoma of skin of left upper limb, including shoulder: Secondary | ICD-10-CM | POA: Diagnosis not present

## 2021-06-05 DIAGNOSIS — L821 Other seborrheic keratosis: Secondary | ICD-10-CM | POA: Diagnosis not present

## 2021-06-05 DIAGNOSIS — Z85828 Personal history of other malignant neoplasm of skin: Secondary | ICD-10-CM | POA: Diagnosis not present

## 2021-06-05 DIAGNOSIS — L570 Actinic keratosis: Secondary | ICD-10-CM | POA: Diagnosis not present

## 2021-06-05 DIAGNOSIS — C44519 Basal cell carcinoma of skin of other part of trunk: Secondary | ICD-10-CM | POA: Diagnosis not present

## 2021-07-03 DIAGNOSIS — Z85828 Personal history of other malignant neoplasm of skin: Secondary | ICD-10-CM | POA: Diagnosis not present

## 2021-07-03 DIAGNOSIS — C44612 Basal cell carcinoma of skin of right upper limb, including shoulder: Secondary | ICD-10-CM | POA: Diagnosis not present

## 2021-07-18 DIAGNOSIS — L0889 Other specified local infections of the skin and subcutaneous tissue: Secondary | ICD-10-CM | POA: Diagnosis not present

## 2021-07-31 DIAGNOSIS — S80812A Abrasion, left lower leg, initial encounter: Secondary | ICD-10-CM | POA: Diagnosis not present

## 2021-07-31 DIAGNOSIS — J449 Chronic obstructive pulmonary disease, unspecified: Secondary | ICD-10-CM | POA: Diagnosis not present

## 2021-07-31 DIAGNOSIS — S20213A Contusion of bilateral front wall of thorax, initial encounter: Secondary | ICD-10-CM | POA: Diagnosis not present

## 2021-07-31 DIAGNOSIS — S63501A Unspecified sprain of right wrist, initial encounter: Secondary | ICD-10-CM | POA: Diagnosis not present

## 2021-07-31 DIAGNOSIS — M7989 Other specified soft tissue disorders: Secondary | ICD-10-CM | POA: Diagnosis not present

## 2021-07-31 DIAGNOSIS — S91312A Laceration without foreign body, left foot, initial encounter: Secondary | ICD-10-CM | POA: Diagnosis not present

## 2021-07-31 DIAGNOSIS — S20219A Contusion of unspecified front wall of thorax, initial encounter: Secondary | ICD-10-CM | POA: Diagnosis not present

## 2021-07-31 DIAGNOSIS — Z041 Encounter for examination and observation following transport accident: Secondary | ICD-10-CM | POA: Diagnosis not present

## 2021-07-31 DIAGNOSIS — R0789 Other chest pain: Secondary | ICD-10-CM | POA: Diagnosis not present

## 2021-07-31 DIAGNOSIS — J439 Emphysema, unspecified: Secondary | ICD-10-CM | POA: Diagnosis not present

## 2021-08-09 DIAGNOSIS — H43393 Other vitreous opacities, bilateral: Secondary | ICD-10-CM | POA: Diagnosis not present

## 2021-08-09 DIAGNOSIS — H40033 Anatomical narrow angle, bilateral: Secondary | ICD-10-CM | POA: Diagnosis not present

## 2021-08-11 DIAGNOSIS — Z681 Body mass index (BMI) 19 or less, adult: Secondary | ICD-10-CM | POA: Diagnosis not present

## 2021-08-11 DIAGNOSIS — S91302A Unspecified open wound, left foot, initial encounter: Secondary | ICD-10-CM | POA: Diagnosis not present

## 2021-08-11 DIAGNOSIS — M792 Neuralgia and neuritis, unspecified: Secondary | ICD-10-CM | POA: Diagnosis not present

## 2021-08-11 DIAGNOSIS — R5383 Other fatigue: Secondary | ICD-10-CM | POA: Diagnosis not present

## 2021-08-11 DIAGNOSIS — J439 Emphysema, unspecified: Secondary | ICD-10-CM | POA: Diagnosis not present

## 2021-08-11 DIAGNOSIS — E7800 Pure hypercholesterolemia, unspecified: Secondary | ICD-10-CM | POA: Diagnosis not present

## 2021-08-11 DIAGNOSIS — M8580 Other specified disorders of bone density and structure, unspecified site: Secondary | ICD-10-CM | POA: Diagnosis not present

## 2021-08-12 DIAGNOSIS — R590 Localized enlarged lymph nodes: Secondary | ICD-10-CM | POA: Diagnosis not present

## 2021-08-12 DIAGNOSIS — Z6820 Body mass index (BMI) 20.0-20.9, adult: Secondary | ICD-10-CM | POA: Diagnosis not present

## 2021-09-24 DIAGNOSIS — H02831 Dermatochalasis of right upper eyelid: Secondary | ICD-10-CM | POA: Diagnosis not present

## 2021-09-24 DIAGNOSIS — H26492 Other secondary cataract, left eye: Secondary | ICD-10-CM | POA: Diagnosis not present

## 2021-09-24 DIAGNOSIS — Z961 Presence of intraocular lens: Secondary | ICD-10-CM | POA: Diagnosis not present

## 2021-09-24 DIAGNOSIS — H18413 Arcus senilis, bilateral: Secondary | ICD-10-CM | POA: Diagnosis not present

## 2021-09-24 DIAGNOSIS — H26493 Other secondary cataract, bilateral: Secondary | ICD-10-CM | POA: Diagnosis not present

## 2021-10-06 DIAGNOSIS — H43393 Other vitreous opacities, bilateral: Secondary | ICD-10-CM | POA: Diagnosis not present

## 2021-11-05 DIAGNOSIS — R911 Solitary pulmonary nodule: Secondary | ICD-10-CM | POA: Diagnosis not present

## 2021-11-05 DIAGNOSIS — J439 Emphysema, unspecified: Secondary | ICD-10-CM | POA: Diagnosis not present

## 2021-11-05 DIAGNOSIS — R509 Fever, unspecified: Secondary | ICD-10-CM | POA: Diagnosis not present

## 2021-11-05 DIAGNOSIS — J111 Influenza due to unidentified influenza virus with other respiratory manifestations: Secondary | ICD-10-CM | POA: Diagnosis not present

## 2021-11-05 DIAGNOSIS — R059 Cough, unspecified: Secondary | ICD-10-CM | POA: Diagnosis not present

## 2021-11-05 DIAGNOSIS — R6889 Other general symptoms and signs: Secondary | ICD-10-CM | POA: Diagnosis not present

## 2021-11-05 DIAGNOSIS — H6693 Otitis media, unspecified, bilateral: Secondary | ICD-10-CM | POA: Diagnosis not present

## 2021-11-20 DIAGNOSIS — R053 Chronic cough: Secondary | ICD-10-CM | POA: Diagnosis not present

## 2021-11-20 DIAGNOSIS — Z681 Body mass index (BMI) 19 or less, adult: Secondary | ICD-10-CM | POA: Diagnosis not present

## 2021-11-20 DIAGNOSIS — Z23 Encounter for immunization: Secondary | ICD-10-CM | POA: Diagnosis not present

## 2021-11-20 DIAGNOSIS — B370 Candidal stomatitis: Secondary | ICD-10-CM | POA: Diagnosis not present

## 2021-12-22 ENCOUNTER — Other Ambulatory Visit: Payer: Self-pay | Admitting: Family Medicine

## 2021-12-22 DIAGNOSIS — Z1231 Encounter for screening mammogram for malignant neoplasm of breast: Secondary | ICD-10-CM

## 2022-02-02 DIAGNOSIS — U071 COVID-19: Secondary | ICD-10-CM | POA: Diagnosis not present

## 2022-02-12 DIAGNOSIS — J439 Emphysema, unspecified: Secondary | ICD-10-CM | POA: Diagnosis not present

## 2022-02-12 DIAGNOSIS — E559 Vitamin D deficiency, unspecified: Secondary | ICD-10-CM | POA: Diagnosis not present

## 2022-02-12 DIAGNOSIS — M792 Neuralgia and neuritis, unspecified: Secondary | ICD-10-CM | POA: Diagnosis not present

## 2022-02-12 DIAGNOSIS — R7303 Prediabetes: Secondary | ICD-10-CM | POA: Diagnosis not present

## 2022-02-12 DIAGNOSIS — E7800 Pure hypercholesterolemia, unspecified: Secondary | ICD-10-CM | POA: Diagnosis not present

## 2022-02-12 DIAGNOSIS — M8580 Other specified disorders of bone density and structure, unspecified site: Secondary | ICD-10-CM | POA: Diagnosis not present

## 2022-02-13 ENCOUNTER — Ambulatory Visit: Payer: Medicare Other

## 2022-02-27 ENCOUNTER — Ambulatory Visit
Admission: RE | Admit: 2022-02-27 | Discharge: 2022-02-27 | Disposition: A | Discharge status: Home / Self Care | Payer: Medicare PPO | Source: Ambulatory Visit | Attending: Family Medicine | Admitting: Family Medicine

## 2022-02-27 DIAGNOSIS — Z1231 Encounter for screening mammogram for malignant neoplasm of breast: Secondary | ICD-10-CM

## 2022-03-05 ENCOUNTER — Encounter: Payer: Self-pay | Admitting: Family Medicine

## 2022-03-05 ENCOUNTER — Other Ambulatory Visit: Payer: Self-pay | Admitting: Family Medicine

## 2022-03-05 DIAGNOSIS — R928 Other abnormal and inconclusive findings on diagnostic imaging of breast: Secondary | ICD-10-CM

## 2022-03-19 ENCOUNTER — Ambulatory Visit: Admission: RE | Admit: 2022-03-19 | Payer: Medicare PPO | Source: Ambulatory Visit

## 2022-03-19 ENCOUNTER — Ambulatory Visit
Admission: RE | Admit: 2022-03-19 | Discharge: 2022-03-19 | Disposition: A | Discharge status: Home / Self Care | Payer: Medicare PPO | Source: Ambulatory Visit | Attending: Family Medicine | Admitting: Family Medicine

## 2022-03-19 DIAGNOSIS — R928 Other abnormal and inconclusive findings on diagnostic imaging of breast: Secondary | ICD-10-CM | POA: Diagnosis not present

## 2022-05-02 DIAGNOSIS — H43393 Other vitreous opacities, bilateral: Secondary | ICD-10-CM | POA: Diagnosis not present

## 2022-05-02 DIAGNOSIS — H40033 Anatomical narrow angle, bilateral: Secondary | ICD-10-CM | POA: Diagnosis not present

## 2022-05-21 DIAGNOSIS — H26491 Other secondary cataract, right eye: Secondary | ICD-10-CM | POA: Diagnosis not present

## 2022-06-04 DIAGNOSIS — L659 Nonscarring hair loss, unspecified: Secondary | ICD-10-CM | POA: Diagnosis not present

## 2022-06-04 DIAGNOSIS — Z139 Encounter for screening, unspecified: Secondary | ICD-10-CM | POA: Diagnosis not present

## 2022-06-04 DIAGNOSIS — Z1331 Encounter for screening for depression: Secondary | ICD-10-CM | POA: Diagnosis not present

## 2022-06-04 DIAGNOSIS — Z9181 History of falling: Secondary | ICD-10-CM | POA: Diagnosis not present

## 2022-07-24 DIAGNOSIS — H43393 Other vitreous opacities, bilateral: Secondary | ICD-10-CM | POA: Diagnosis not present

## 2022-08-15 DIAGNOSIS — H04123 Dry eye syndrome of bilateral lacrimal glands: Secondary | ICD-10-CM | POA: Diagnosis not present

## 2022-08-20 DIAGNOSIS — E559 Vitamin D deficiency, unspecified: Secondary | ICD-10-CM | POA: Diagnosis not present

## 2022-08-20 DIAGNOSIS — J439 Emphysema, unspecified: Secondary | ICD-10-CM | POA: Diagnosis not present

## 2022-08-20 DIAGNOSIS — R7303 Prediabetes: Secondary | ICD-10-CM | POA: Diagnosis not present

## 2022-08-20 DIAGNOSIS — M8580 Other specified disorders of bone density and structure, unspecified site: Secondary | ICD-10-CM | POA: Diagnosis not present

## 2022-08-20 DIAGNOSIS — M792 Neuralgia and neuritis, unspecified: Secondary | ICD-10-CM | POA: Diagnosis not present

## 2022-08-20 DIAGNOSIS — E7800 Pure hypercholesterolemia, unspecified: Secondary | ICD-10-CM | POA: Diagnosis not present

## 2022-08-20 DIAGNOSIS — R202 Paresthesia of skin: Secondary | ICD-10-CM | POA: Diagnosis not present

## 2022-10-08 DIAGNOSIS — Z85828 Personal history of other malignant neoplasm of skin: Secondary | ICD-10-CM | POA: Diagnosis not present

## 2022-10-08 DIAGNOSIS — C44619 Basal cell carcinoma of skin of left upper limb, including shoulder: Secondary | ICD-10-CM | POA: Diagnosis not present

## 2022-10-08 DIAGNOSIS — L821 Other seborrheic keratosis: Secondary | ICD-10-CM | POA: Diagnosis not present

## 2022-10-08 DIAGNOSIS — D692 Other nonthrombocytopenic purpura: Secondary | ICD-10-CM | POA: Diagnosis not present

## 2022-10-08 DIAGNOSIS — D485 Neoplasm of uncertain behavior of skin: Secondary | ICD-10-CM | POA: Diagnosis not present

## 2022-10-08 DIAGNOSIS — L820 Inflamed seborrheic keratosis: Secondary | ICD-10-CM | POA: Diagnosis not present

## 2022-10-08 DIAGNOSIS — C44311 Basal cell carcinoma of skin of nose: Secondary | ICD-10-CM | POA: Diagnosis not present

## 2022-10-08 DIAGNOSIS — C44712 Basal cell carcinoma of skin of right lower limb, including hip: Secondary | ICD-10-CM | POA: Diagnosis not present

## 2022-10-08 DIAGNOSIS — L570 Actinic keratosis: Secondary | ICD-10-CM | POA: Diagnosis not present

## 2022-11-12 DIAGNOSIS — Z1331 Encounter for screening for depression: Secondary | ICD-10-CM | POA: Diagnosis not present

## 2022-11-12 DIAGNOSIS — Z85828 Personal history of other malignant neoplasm of skin: Secondary | ICD-10-CM | POA: Diagnosis not present

## 2022-11-12 DIAGNOSIS — C44311 Basal cell carcinoma of skin of nose: Secondary | ICD-10-CM | POA: Diagnosis not present

## 2022-11-12 DIAGNOSIS — Z139 Encounter for screening, unspecified: Secondary | ICD-10-CM | POA: Diagnosis not present

## 2022-11-12 DIAGNOSIS — Z0000 Encounter for general adult medical examination without abnormal findings: Secondary | ICD-10-CM | POA: Diagnosis not present

## 2022-11-12 DIAGNOSIS — Z9181 History of falling: Secondary | ICD-10-CM | POA: Diagnosis not present

## 2022-12-16 DIAGNOSIS — J449 Chronic obstructive pulmonary disease, unspecified: Secondary | ICD-10-CM | POA: Diagnosis not present

## 2022-12-16 DIAGNOSIS — Z20822 Contact with and (suspected) exposure to covid-19: Secondary | ICD-10-CM | POA: Diagnosis not present

## 2022-12-16 DIAGNOSIS — J209 Acute bronchitis, unspecified: Secondary | ICD-10-CM | POA: Diagnosis not present

## 2023-01-21 DIAGNOSIS — R053 Chronic cough: Secondary | ICD-10-CM | POA: Diagnosis not present

## 2023-01-21 DIAGNOSIS — Z23 Encounter for immunization: Secondary | ICD-10-CM | POA: Diagnosis not present

## 2023-01-21 DIAGNOSIS — J439 Emphysema, unspecified: Secondary | ICD-10-CM | POA: Diagnosis not present

## 2023-01-21 DIAGNOSIS — R1909 Other intra-abdominal and pelvic swelling, mass and lump: Secondary | ICD-10-CM | POA: Diagnosis not present

## 2023-01-21 DIAGNOSIS — K589 Irritable bowel syndrome without diarrhea: Secondary | ICD-10-CM | POA: Diagnosis not present

## 2023-01-22 ENCOUNTER — Other Ambulatory Visit: Payer: Self-pay | Admitting: Family Medicine

## 2023-01-22 DIAGNOSIS — R1909 Other intra-abdominal and pelvic swelling, mass and lump: Secondary | ICD-10-CM

## 2023-02-11 DIAGNOSIS — H1013 Acute atopic conjunctivitis, bilateral: Secondary | ICD-10-CM | POA: Diagnosis not present

## 2023-02-18 ENCOUNTER — Ambulatory Visit
Admission: RE | Admit: 2023-02-18 | Discharge: 2023-02-18 | Disposition: A | Discharge status: Home / Self Care | Payer: Medicare PPO | Source: Ambulatory Visit | Attending: Family Medicine | Admitting: Family Medicine

## 2023-02-18 ENCOUNTER — Other Ambulatory Visit: Payer: Self-pay | Admitting: Family Medicine

## 2023-02-18 DIAGNOSIS — I700 Atherosclerosis of aorta: Secondary | ICD-10-CM | POA: Diagnosis not present

## 2023-02-18 DIAGNOSIS — Z853 Personal history of malignant neoplasm of breast: Secondary | ICD-10-CM | POA: Diagnosis not present

## 2023-02-18 DIAGNOSIS — Z0000 Encounter for general adult medical examination without abnormal findings: Secondary | ICD-10-CM

## 2023-02-18 DIAGNOSIS — K5641 Fecal impaction: Secondary | ICD-10-CM | POA: Diagnosis not present

## 2023-02-18 DIAGNOSIS — R1909 Other intra-abdominal and pelvic swelling, mass and lump: Secondary | ICD-10-CM

## 2023-02-18 MED ORDER — IOPAMIDOL (ISOVUE-300) INJECTION 61%
100.0000 mL | Freq: Once | INTRAVENOUS | Status: AC | PRN
  Administered 2023-02-18: 100 mL via INTRAVENOUS

## 2023-03-04 DIAGNOSIS — E7800 Pure hypercholesterolemia, unspecified: Secondary | ICD-10-CM | POA: Diagnosis not present

## 2023-03-04 DIAGNOSIS — R7303 Prediabetes: Secondary | ICD-10-CM | POA: Diagnosis not present

## 2023-03-04 DIAGNOSIS — M8580 Other specified disorders of bone density and structure, unspecified site: Secondary | ICD-10-CM | POA: Diagnosis not present

## 2023-03-04 DIAGNOSIS — Z23 Encounter for immunization: Secondary | ICD-10-CM | POA: Diagnosis not present

## 2023-03-04 DIAGNOSIS — J439 Emphysema, unspecified: Secondary | ICD-10-CM | POA: Diagnosis not present

## 2023-03-04 DIAGNOSIS — E559 Vitamin D deficiency, unspecified: Secondary | ICD-10-CM | POA: Diagnosis not present

## 2023-03-04 DIAGNOSIS — M792 Neuralgia and neuritis, unspecified: Secondary | ICD-10-CM | POA: Diagnosis not present

## 2023-03-26 ENCOUNTER — Ambulatory Visit
Admission: RE | Admit: 2023-03-26 | Discharge: 2023-03-26 | Disposition: A | Discharge status: Home / Self Care | Payer: Medicare PPO | Source: Ambulatory Visit | Attending: Family Medicine | Admitting: Family Medicine

## 2023-03-26 DIAGNOSIS — Z0000 Encounter for general adult medical examination without abnormal findings: Secondary | ICD-10-CM

## 2023-03-26 DIAGNOSIS — Z1231 Encounter for screening mammogram for malignant neoplasm of breast: Secondary | ICD-10-CM | POA: Diagnosis not present

## 2023-04-09 DIAGNOSIS — J440 Chronic obstructive pulmonary disease with acute lower respiratory infection: Secondary | ICD-10-CM | POA: Diagnosis not present

## 2023-04-09 DIAGNOSIS — J209 Acute bronchitis, unspecified: Secondary | ICD-10-CM | POA: Diagnosis not present

## 2023-05-07 DIAGNOSIS — J439 Emphysema, unspecified: Secondary | ICD-10-CM | POA: Diagnosis not present

## 2023-05-07 DIAGNOSIS — R0609 Other forms of dyspnea: Secondary | ICD-10-CM | POA: Diagnosis not present

## 2023-05-13 DIAGNOSIS — R0609 Other forms of dyspnea: Secondary | ICD-10-CM | POA: Diagnosis not present

## 2023-05-18 DIAGNOSIS — J439 Emphysema, unspecified: Secondary | ICD-10-CM | POA: Diagnosis not present

## 2023-06-18 DIAGNOSIS — J439 Emphysema, unspecified: Secondary | ICD-10-CM | POA: Diagnosis not present

## 2023-06-30 IMAGING — MG MM DIGITAL SCREENING BILAT W/ TOMO AND CAD
8 series · 9 of 24 positions shown · non-contrast
Comparison: Previous exam(s).

CLINICAL DATA: Screening.

EXAM:
DIGITAL SCREENING BILATERAL MAMMOGRAM WITH TOMOSYNTHESIS AND CAD
TECHNIQUE: Bilateral screening digital craniocaudal and mediolateral oblique
mammograms were obtained. Bilateral screening digital breast
tomosynthesis was performed. The images were evaluated with
computer-aided detection.

[R MLO synth-2D]
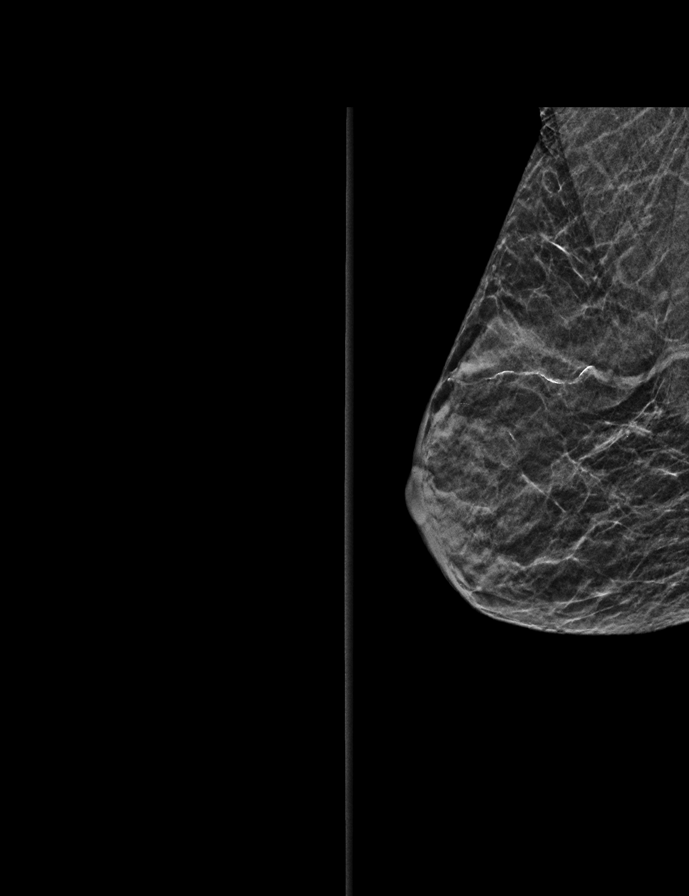

[R CC synth-2D]
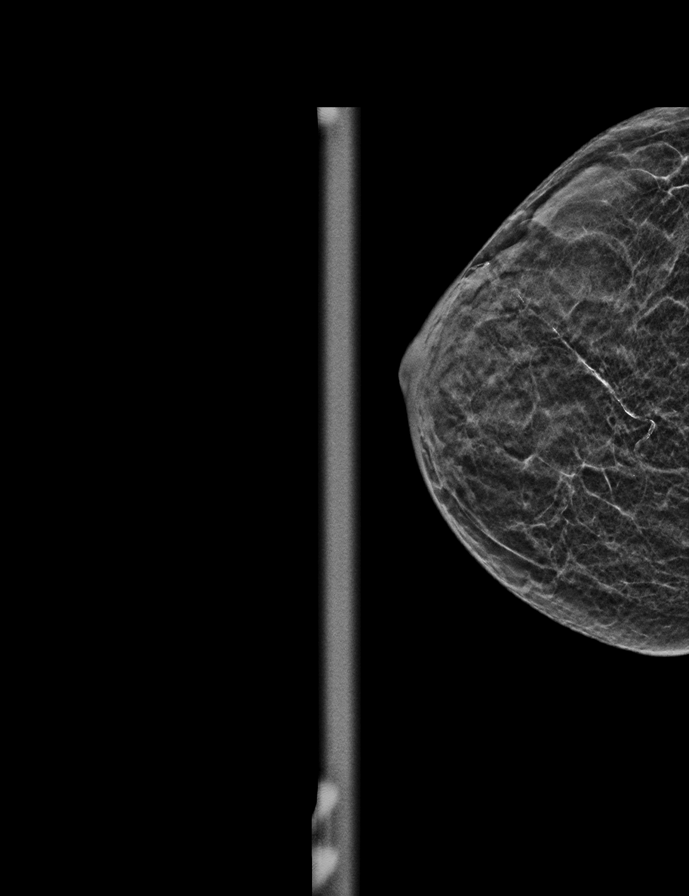

[L CC synth-2D]
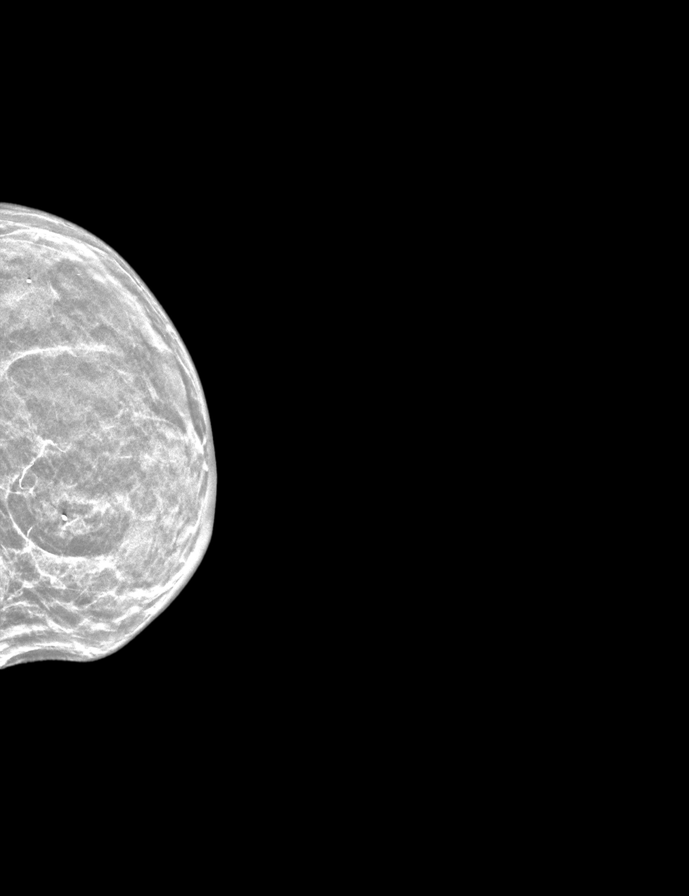

[L MLO synth-2D]
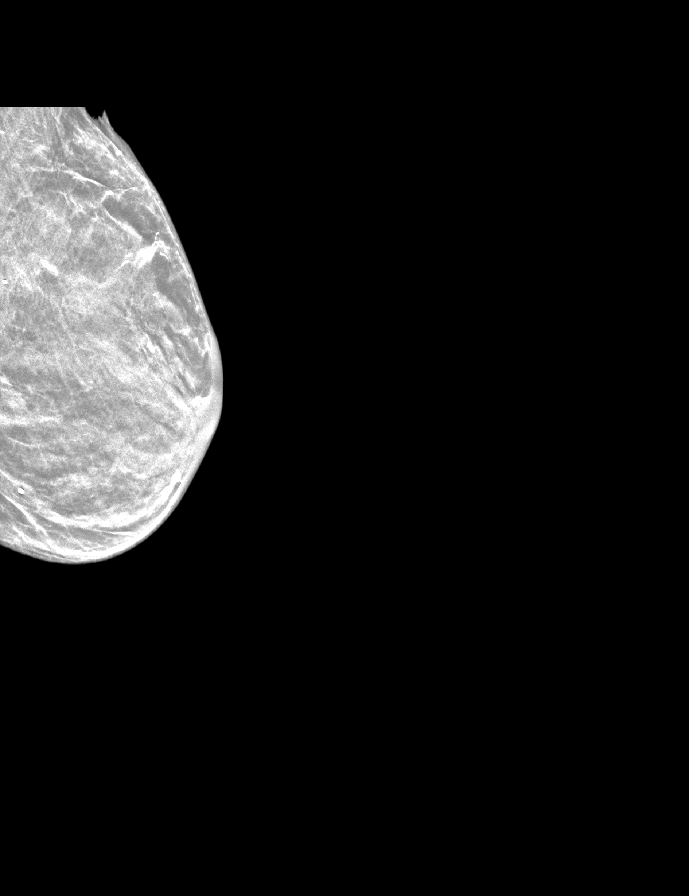

[L CC tomo · 2 of 26 frames shown]
[frame 9/26]
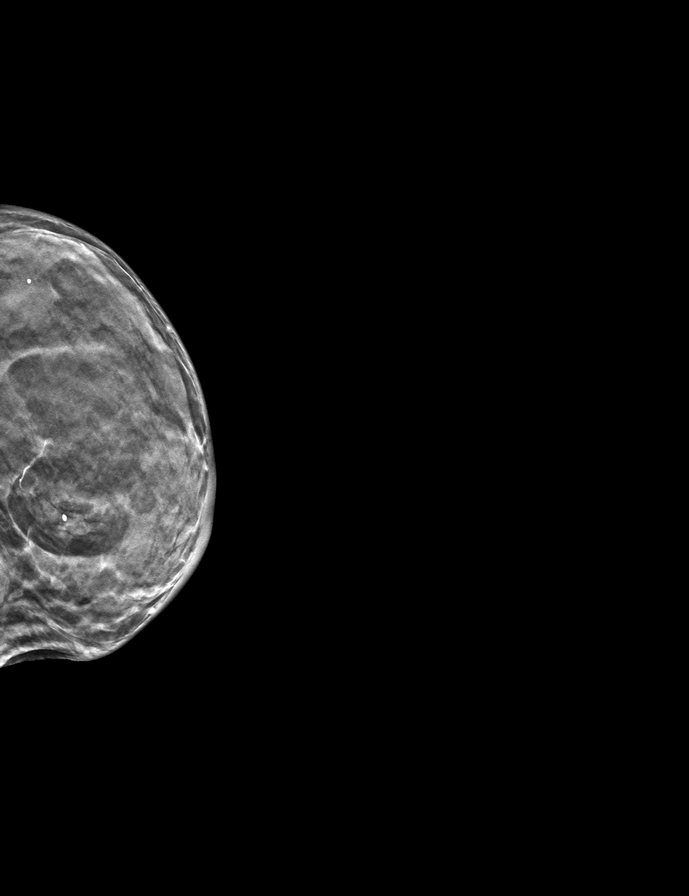
[frame 13/26]
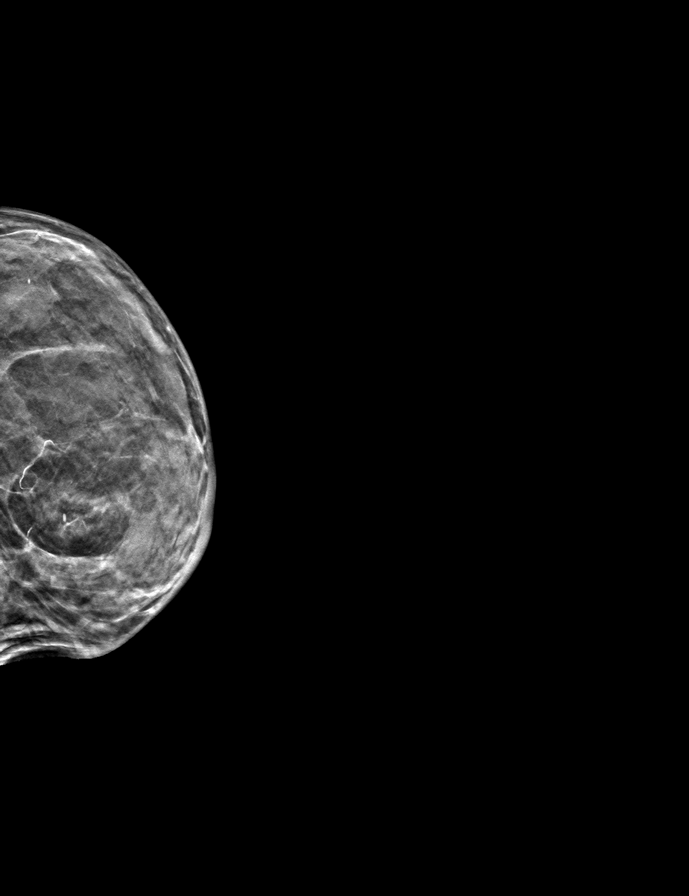

[L MLO tomo · tomo slice 15/28.0]
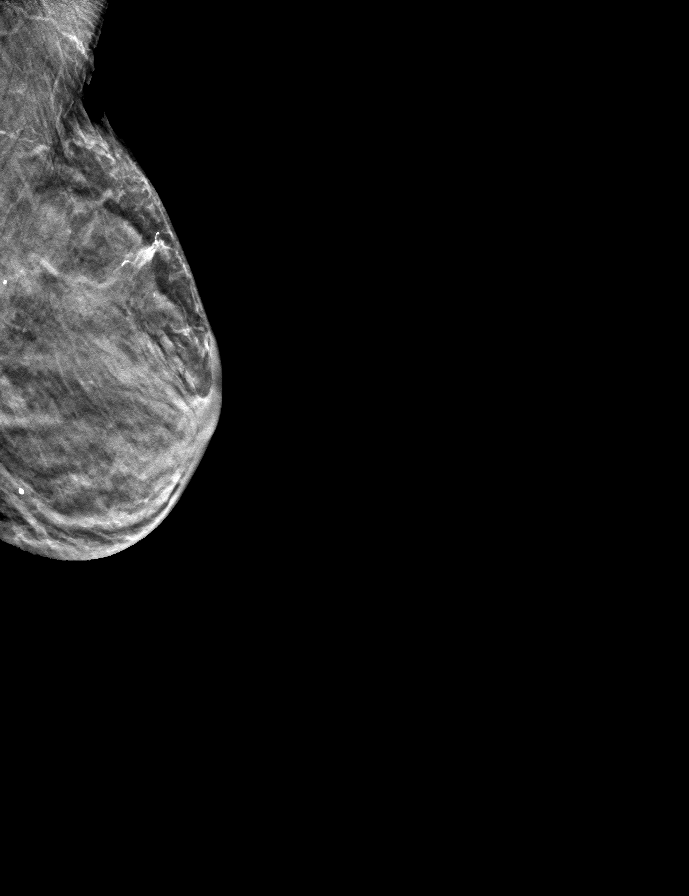

[R MLO tomo · tomo slice 13/25.0]
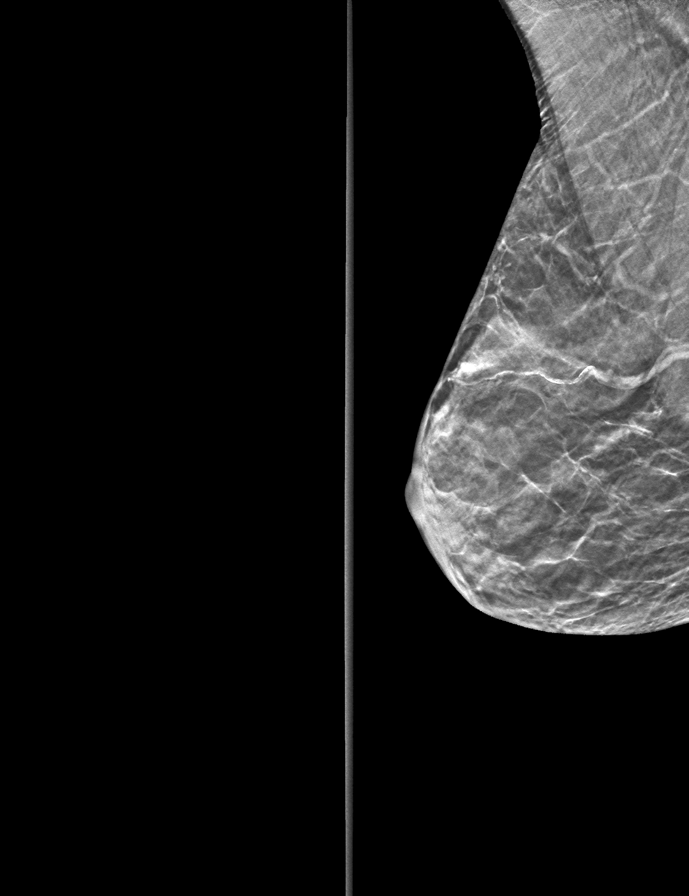

[R CC tomo · tomo slice 11/21.0]
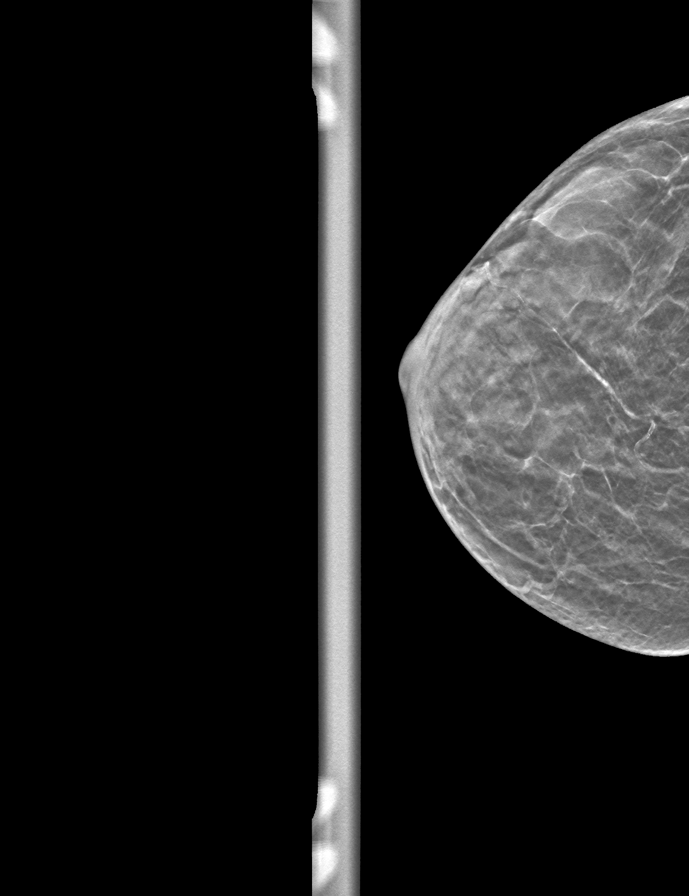

[9 of 24 positions shown; findings below may reference images not displayed]

ACR Breast Density Category d: The breast tissue is extremely dense,
which lowers the sensitivity of mammography
FINDINGS: There are no findings suspicious for malignancy.
IMPRESSION: No mammographic evidence of malignancy. A result letter of this
screening mammogram will be mailed directly to the patient.

RECOMMENDATION:
Screening mammogram in one year. (Code:TA-V-WV9)

BI-RADS CATEGORY  1: Negative.

## 2023-07-18 DIAGNOSIS — J439 Emphysema, unspecified: Secondary | ICD-10-CM | POA: Diagnosis not present

## 2023-08-11 ENCOUNTER — Ambulatory Visit: Admitting: Internal Medicine

## 2023-08-11 ENCOUNTER — Encounter: Payer: Self-pay | Admitting: Internal Medicine

## 2023-08-11 VITALS — BP 144/90 | HR 72 | Temp 98.7°F | Ht 63.0 in | Wt 103.0 lb

## 2023-08-11 DIAGNOSIS — J439 Emphysema, unspecified: Secondary | ICD-10-CM

## 2023-08-11 DIAGNOSIS — J4489 Other specified chronic obstructive pulmonary disease: Secondary | ICD-10-CM

## 2023-08-11 DIAGNOSIS — Z87891 Personal history of nicotine dependence: Secondary | ICD-10-CM | POA: Diagnosis not present

## 2023-08-11 DIAGNOSIS — Z122 Encounter for screening for malignant neoplasm of respiratory organs: Secondary | ICD-10-CM

## 2023-08-11 NOTE — Patient Instructions (Addendum)
 It was a pleasure to see you today!  Please schedule follow up with myself in 3 months.  If my schedule is not open yet, we will contact you with a reminder closer to that time. Please call [phone] if you haven't heard from us  a month before, and always call us  sooner if issues or concerns arise. You can also send us  a message through MyChart, but but aware that this is not to be used for urgent issues and it may take up to 5-7 days to receive a reply. Please be aware that you will likely be able to view your results before I have a chance to respond to them. Please give us  5 business days to respond to any non-urgent results.    YOUR PLAN:  -CHRONIC OBSTRUCTIVE PULMONARY DISEASE (COPD) WITH SUPPLEMENTAL OXYGEN  DEPENDENCE: COPD is a chronic lung condition that makes it hard to breathe. We talked about the importance of using oxygen  to help you breathe better and prevent complications. We will order a pulmonary function test to check your lung function and an overnight oximetry test to see how your oxygen  levels are at night. Please wear 2L oxygen  at night when you take this test. Please use your oxygen  at night and during activities, and make sure you have enough nebulizer treatments. Continue trelegy inhaler 1 puff once a day. Continue albuterol nebulizer treatments as needed  -LUNG CANCER SCREENING IN FORMER SMOKER: Because of your history of smoking, you are at a higher risk for lung cancer. We discussed the importance of annual lung cancer screening with a low-dose CT scan to catch any potential issues early. We will refer you to a lung cancer screening program and arrange a meeting with a nurse practitioner to go over the risks and benefits of screening.   Understanding COPD   What is COPD? COPD stands for chronic obstructive pulmonary (lung) disease. COPD is a general term used for several lung diseases.  COPD is an umbrella term and encompasses other  common diseases in this group like  chronic bronchitis and emphysema. Chronic asthma may also be included in this group. While some patients with COPD have only chronic bronchitis or emphysema, most patients have a combination of both.  You might hear these terms used in exchange for one another.   COPD adds to the work of the heart. Diseased lungs may reduce the amount of oxygen  that goes to the blood. High blood pressure in blood vessels from the heart to the lungs makes it difficult for the heart to pump. Lung disease can also cause the body to produce too many red blood cells which may make the blood thicker and harder to pump.   Patients who have COPD with low oxygen  levels may develop an enlarged heart (cor pulmonale). This condition weakens the heart and causes increased shortness of breath and swelling in the legs and feet.   Chronic bronchitis Chronic bronchitis is irritation and inflammation (swelling) of the lining in the bronchial tubes (air passages). The irritation causes coughing and an excess amount of mucus in the airways. The swelling makes it difficult to get air in and out of the lungs. The small, hair-like structures on the inside of the airways (called cilia) may be damaged by the irritation. The cilia are then unable to help clean mucus from the airways.  Bronchitis is generally considered to be chronic when you have: a productive cough (cough up mucus) and shortness of breath that lasts about 3 months  or more each year for 2 or more years in a row. Your doctor may define chronic bronchitis differently.   Emphysema Emphysema is the destruction, or breakdown, of the walls of the alveoli (air sacs) located at the end of the bronchial tubes. The damaged alveoli are not able to exchange oxygen  and carbon dioxide between the lungs and the blood. The bronchioles lose their elasticity and collapse when you exhale, trapping air in the lungs. The trapped air keeps fresh air and oxygen  from entering the lungs.   Who is  affected by COPD? Emphysema and chronic bronchitis affect approximately 16 million people in the [place] , or close to 11 percent of the population.   Symptoms of COPD  Shortness of breath  Shortness of breath with mild exercise (walking, using the stairs, etc.)  Chronic, productive cough (with mucus)  A feeling of tightness in the chest  Wheezing   What causes COPD? The two primary causes of COPD are cigarette smoking and alpha1-antitrypsin (AAT) deficiency. Air pollution and occupational dusts may also contribute to COPD, especially when the person exposed to these substances is a cigarette smoker.  Cigarette smoke causes COPD by irritating the airways and creating inflammation that narrows the airways, making it more difficult to breathe. Cigarette smoke also causes the cilia to stop working properly so mucus and trapped particles are not cleaned from the airways. As a result, chronic cough and excess mucus production develop, leading to chronic bronchitis.  In some people, chronic bronchitis and infections can lead to destruction of the small airways, or emphysema.  AAT deficiency, an inherited disorder, can also lead to emphysema. Alpha antitrypsin (AAT) is a protective material produced in the liver and transported to the lungs to help combat inflammation. When there is not enough of the chemical AAT, the body is no longer protected from an enzyme in the white blood cells.   How is COPD diagnosed?  To diagnose COPD, the physician needs to know: Do you smoke?  Have you had chronic exposure to dust or air pollutants?  Do other members of your family have lung disease?  Are you short of breath?  Do you get short of breath with exercise?  Do you have chronic cough and/or wheezing?  Do you cough up excess mucus?  To help with the diagnosis, the physician will conduct a thorough physical exam which includes:  Listening to your lungs and heart  Checking your blood pressure and pulse   Examining your nose and throat  Checking your feet and ankles for swelling   Laboratory and other tests Several laboratory and other tests are needed to confirm a diagnosis of COPD. These tests may include:  Chest X-ray to look for lung changes that could be caused by COPD   Spirometry and pulmonary function tests (PFTs) to determine lung volume and air flow  Pulse oximetry to measure the saturation of oxygen  in the blood  Arterial blood gases (ABGs) to determine the amount of oxygen  and carbon dioxide in the blood  Exercise testing to determine if the oxygen  level in the blood drops during exercise   Treatment In the beginning stages of COPD, there is minimal shortness of breath that may be noticed only during exercise. As the disease progresses, shortness of breath may worsen and you may need to wear an oxygen  device.   To help control other symptoms of COPD, the following treatments and lifestyle changes may be prescribed.  Quitting smoking  Avoiding cigarette smoke and  other irritants  Taking medications including: a. bronchodilators b. anti-inflammatory agents c. oxygen  d. antibiotics  Maintaining a healthy diet  Following a structured exercise program such as pulmonary rehabilitation Preventing respiratory infections  Controlling stress   If your COPD progresses, you may be eligible to be evaluated for lung volume reduction surgery or lung transplantation. You may also be eligible to participate in certain clinical trials (research studies). Ask your health care providers about studies being conducted in your hospital.   What is the outlook? Although COPD can not be cured, its symptoms can be treated and your quality of life can be improved. Your prognosis or outlook for the future will depend on how well your lungs are functioning, your symptoms, and how well you respond to and follow your treatment plan.

## 2023-08-11 NOTE — Progress Notes (Signed)
 Shilynn Hoch    [id number]    07/27/1950  Primary Care Physician:Hamrick, Charlene CROME, MD  Referring Physician: Stephanie Charlene CROME, MD [address] [place],  [place] [address] Reason for Consultation: shortness of breath Date of Consultation: 08/11/2023  Chief complaint:   Chief Complaint  Patient presents with   Consult   Shortness of Breath     HPI:  Discussed the use of AI scribe software for clinical note transcription with the patient, who gave verbal consent to proceed.  History of Present Illness Candace Howe is a 73 year old female with COPD who presents with worsening breathing issues. History of breast cancer s/p chemo,radiation, lumpectomy of left breast. She was referred by Dr. Stephanie for evaluation of her breathing issues.  She has been experiencing worsening breathing issues for approximately one year. She uses oxygen  at home primarily when sitting and watching TV, but not at night, and is concerned about becoming dependent on it. Oxygen  was prescribed in May, about three months ago, by her primary care doctor.  She has a history of pneumonia and COVID-19, which she experienced simultaneously last year, leading to significant illness. She has had pneumonia several times but denies any history of bronchitis. She has never been hospitalized for these conditions.  Her current medications include albuterol and Trelegy inhalers. She uses the albuterol rescue inhaler or nebulizer two to three times daily, particularly in the morning and after work, due to significant shortness of breath during these times. She was previously on Breztri but did not notice a significant difference between the two medications. She has been prescribed prednisone  in the past, typically during episodes of pneumonia, about twice a year.  She has a significant smoking history, having smoked for 25 years before quitting around 2014. She started smoking in 1980 and was a heavy  smoker, consuming less than a pack a day. Her father also smoked, which exposed her to secondhand smoke during her childhood.  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 She lives alone in [place] and has five outdoor cats. She does not drink alcohol.   Social history:  Occupation: part-time Programme researcher, broadcasting/film/video Exposures: lives at home independently. 5 cats, outdoor Smoking history: 25 pack years, passive smoke exposure in childhood and an adulthood  Social History   Occupational History   Occupation: Engineer, production    Comment: Lawyer  Tobacco Use   Smoking status: Former    Current packs/day: 0.00    Average packs/day: 0.8 packs/day for 34.0 years (25.5 ttl pk-yrs)    Types: Cigarettes    Start date: 72    Quit date: 2014    Years since quitting: 11.6   Smokeless tobacco: Never  Vaping Use   Vaping status: Never Used  Substance and Sexual Activity   Alcohol use: No   Drug use: No   Sexual activity: Never    Birth control/protection: Surgical    Relevant family history:  Family History  Problem Relation Age of Onset   Cancer Father        lung Ca at age 44 yrs   Breast cancer Neg Hx     Past Medical History:  Diagnosis Date   Anxiety    takes Ativan prn   Basal cell carcinoma    Breast cancer (HCC) 02/1997   lumpectomy w/ alnd, chemo, rt, and tamoxifen   Depression  takes Celexa daily   Insomnia    takes Elavil nightly   Neuromuscular disorder (HCC)    nerve damage under left arm   Personal history of chemotherapy 1999   Left Breast Cancer   Personal history of radiation therapy 1999   Left Breast Cancer   Squamous cell skin cancer     Past Surgical History:  Procedure Laterality Date   ABDOMINAL HYSTERECTOMY  1986   BREAST BIOPSY Right 05/02/2012   Procedure: RIGHT BREAST WIRE GUIDED BIOPSY;  Surgeon: Donnice Bury, MD;  Location: [facility] OR;  Service: General;  Laterality: Right;    BREAST LUMPECTOMY Left 1999   Physical Exam: Blood pressure (!) 144/90, temperature 98.7 F (37.1 C), temperature source Oral, height 5' 3 (1.6 m), weight 103 lb (46.7 kg). Gen:      No acute distress, thin ENT:  nasal cannula no nasal polyps, mucus membranes moist Lungs:    No increased respiratory effort, symmetric chest wall excursion, diminished, clear to auscultation bilaterally, no wheezes or crackles CV:         Regular rate and rhythm; no murmurs, rubs, or gallops.  No pedal edema Abd:      + bowel sounds; soft, non-tender; no distension MSK: no acute synovitis of DIP or PIP joints, no mechanics hands.  Skin:      Warm and dry; no rashes Neuro: normal speech, no focal facial asymmetry Psych: alert and oriented x3, normal mood and affect   Data Reviewed/Medical Decision Making:  Independent interpretation of tests: Imaging:  Review of patient's CT Chest 2017  images revealed centrilobular emphysema stable 7mm nodule unchanged. The patient's images have been independently reviewed by me.    PFTs:  Labs:  Lab Results  Component Value Date   NA 143 03/18/2015   K 4.3 03/18/2015   CO2 31 (H) 03/18/2015   GLUCOSE 108 03/18/2015   BUN 19.3 03/18/2015   CREATININE 0.8 03/18/2015   CALCIUM 8.9 03/18/2015   EGFR 77 (L) 03/18/2015   GFRNONAA 89 (L) 05/02/2012   Lab Results  Component Value Date   WBC 4.9 03/18/2015   HGB 15.7 03/18/2015   HCT 48.2 (H) 03/18/2015   MCV 93.3 03/18/2015   PLT 223 03/18/2015     Immunization status:   There is no immunization history on file for this patient.   I reviewed prior external note(s) from oncology, radiation oncology  I reviewed the result(s) of the labs and imaging as noted above.   I have ordered pft, ono  Assessment and Plan Assessment & Plan Chronic obstructive pulmonary disease (COPD) with  Chronic hypoxemic respiratory failure COPD with worsening dyspnea over the past year. Emphasized necessity of oxygen  for  quality of life and longevity. Explained oxygen  use is essential for maintaining adequate levels and preventing complications. - Order pulmonary function test (PFT) to assess lung function severity. - Order overnight oximetry test to evaluate nocturnal oxygen  levels.  - Consider ABG for hypercapnia evaluation based on these tests, potential bipap nocturnally.  - Encourage use of oxygen  at night and during activities. - Continue trelegy 1 puff once daily and albuterol nebulizer treatments as needed  Lung cancer screening in former smoker Need for lung cancer screening Former smoker with significant history, increasing lung cancer risk. Discussed importance of annual screening with low-dose CT for early detection and potential curative treatment. - Refer to lung cancer screening program for low-dose CT scan.    Return to Care: Return in about 3 months (around 11/11/2023).  Verdon Gore, MD Pulmonary and Critical Care Medicine [facility] Office:[phone]  CC: Hamrick, Charlene CROME, MD

## 2023-08-16 ENCOUNTER — Telehealth: Payer: Self-pay | Admitting: Internal Medicine

## 2023-08-16 DIAGNOSIS — J9610 Chronic respiratory failure, unspecified whether with hypoxia or hypercapnia: Secondary | ICD-10-CM

## 2023-08-16 NOTE — Telephone Encounter (Signed)
 Ono 2L

## 2023-08-16 NOTE — Telephone Encounter (Addendum)
 Hey Dr. Meade, [employer] needs for you to add the RX on how the ONO is to be done. Thank you!

## 2023-08-17 NOTE — Telephone Encounter (Signed)
 Hey, [employer] needs it to state on the order if it's on room air, with o2 or on cpap.

## 2023-08-18 DIAGNOSIS — J439 Emphysema, unspecified: Secondary | ICD-10-CM | POA: Diagnosis not present

## 2023-08-18 NOTE — Telephone Encounter (Signed)
 Thank you. I spoke with Dolanda With [employer] to confirm if ONO is correct. She is sending a message over to Avelina to confirm if the order is good to go. They will give me a callback for confirmation.

## 2023-08-18 NOTE — Telephone Encounter (Signed)
 Candace Howe  Candace Howe, Candace Howe Candace Howe, Candace Howe, Candace Howe; 1 other It is not on the order we need it to state on order if it's on room air, with o2 or on cpap how ono is to be done

## 2023-08-20 NOTE — Telephone Encounter (Deleted)
 Please Advise

## 2023-08-24 NOTE — Telephone Encounter (Signed)
 Candace Howe, please add on 2L in comments on order not in scheduling instructions.

## 2023-08-24 NOTE — Telephone Encounter (Signed)
ONO ordered.  

## 2023-08-24 NOTE — Addendum Note (Signed)
 Addended by: CLAUDENE NEVINS A on: 08/24/2023 09:35 AM   Modules accepted: Orders

## 2023-08-26 DIAGNOSIS — E7800 Pure hypercholesterolemia, unspecified: Secondary | ICD-10-CM | POA: Diagnosis not present

## 2023-08-26 DIAGNOSIS — E559 Vitamin D deficiency, unspecified: Secondary | ICD-10-CM | POA: Diagnosis not present

## 2023-08-26 DIAGNOSIS — M8580 Other specified disorders of bone density and structure, unspecified site: Secondary | ICD-10-CM | POA: Diagnosis not present

## 2023-08-26 DIAGNOSIS — R7303 Prediabetes: Secondary | ICD-10-CM | POA: Diagnosis not present

## 2023-08-26 DIAGNOSIS — Z23 Encounter for immunization: Secondary | ICD-10-CM | POA: Diagnosis not present

## 2023-08-26 DIAGNOSIS — J439 Emphysema, unspecified: Secondary | ICD-10-CM | POA: Diagnosis not present

## 2023-08-26 DIAGNOSIS — M792 Neuralgia and neuritis, unspecified: Secondary | ICD-10-CM | POA: Diagnosis not present

## 2023-09-11 DIAGNOSIS — R0902 Hypoxemia: Secondary | ICD-10-CM | POA: Diagnosis not present

## 2023-09-11 DIAGNOSIS — G4730 Sleep apnea, unspecified: Secondary | ICD-10-CM | POA: Diagnosis not present

## 2023-09-13 ENCOUNTER — Telehealth: Payer: Self-pay | Admitting: *Deleted

## 2023-09-13 NOTE — Telephone Encounter (Signed)
 CMN received for oxygen  and nebulizer medications.  Placed in sign folder for Dr. Meade.

## 2023-09-14 ENCOUNTER — Encounter: Payer: Self-pay | Admitting: Internal Medicine

## 2023-09-15 ENCOUNTER — Ambulatory Visit: Payer: Self-pay | Admitting: Internal Medicine

## 2023-09-15 DIAGNOSIS — J9610 Chronic respiratory failure, unspecified whether with hypoxia or hypercapnia: Secondary | ICD-10-CM

## 2023-09-15 NOTE — Telephone Encounter (Signed)
 Patient has desaturation overnight while wearing 2LNC. Would likely benefit from PAP for COPD.she is scheduled for PFT. Before that I want her to have ABG done on room air at the hospital. Please schedule. Order placed.

## 2023-09-16 DIAGNOSIS — J439 Emphysema, unspecified: Secondary | ICD-10-CM | POA: Diagnosis not present

## 2023-09-18 DIAGNOSIS — J439 Emphysema, unspecified: Secondary | ICD-10-CM | POA: Diagnosis not present

## 2023-09-23 DIAGNOSIS — D126 Benign neoplasm of colon, unspecified: Secondary | ICD-10-CM | POA: Diagnosis not present

## 2023-09-23 DIAGNOSIS — Z86018 Personal history of other benign neoplasm: Secondary | ICD-10-CM | POA: Diagnosis not present

## 2023-09-23 DIAGNOSIS — R109 Unspecified abdominal pain: Secondary | ICD-10-CM | POA: Diagnosis not present

## 2023-09-23 DIAGNOSIS — J449 Chronic obstructive pulmonary disease, unspecified: Secondary | ICD-10-CM | POA: Diagnosis not present

## 2023-10-07 DIAGNOSIS — K635 Polyp of colon: Secondary | ICD-10-CM | POA: Diagnosis not present

## 2023-10-07 DIAGNOSIS — D125 Benign neoplasm of sigmoid colon: Secondary | ICD-10-CM | POA: Diagnosis not present

## 2023-10-07 DIAGNOSIS — Z860101 Personal history of adenomatous and serrated colon polyps: Secondary | ICD-10-CM | POA: Diagnosis not present

## 2023-10-07 DIAGNOSIS — Z09 Encounter for follow-up examination after completed treatment for conditions other than malignant neoplasm: Secondary | ICD-10-CM | POA: Diagnosis not present

## 2023-10-07 DIAGNOSIS — K5730 Diverticulosis of large intestine without perforation or abscess without bleeding: Secondary | ICD-10-CM | POA: Diagnosis not present

## 2023-10-07 DIAGNOSIS — D128 Benign neoplasm of rectum: Secondary | ICD-10-CM | POA: Diagnosis not present

## 2023-10-08 ENCOUNTER — Telehealth: Payer: Self-pay | Admitting: Acute Care

## 2023-10-08 DIAGNOSIS — Z122 Encounter for screening for malignant neoplasm of respiratory organs: Secondary | ICD-10-CM

## 2023-10-08 DIAGNOSIS — Z87891 Personal history of nicotine dependence: Secondary | ICD-10-CM

## 2023-10-08 NOTE — Telephone Encounter (Signed)
 Lung Cancer Screening Narrative/Criteria Questionnaire (Cigarette Smokers Only- No Cigars/Pipes/vapes)   Candace Howe   SDMV:10/20/2023 10:00 [name]      02-23-1950   LDCT: 10/21/2023 9:40 GI    73 y.o.   Phone: [phone]  Lung Screening Narrative (confirm age 23-77 yrs Medicare / 50-80 yrs Private pay insurance)   Insurance information:[employer] mcr   Referring Provider:Dr. Meade   This screening involves an initial phone call with a team member from our program. It is called a shared decision making visit. The initial meeting is required by  insurance and Medicare to make sure you understand the program. This appointment takes about 15-20 minutes to complete. You will complete the screening scan at your scheduled date/time.  This scan takes about 5-10 minutes to complete. You can eat and drink normally before and after the scan.  Criteria questions for Lung Cancer Screening:   Are you a current or former smoker? Former Age began smoking: 73yo   If you are a former smoker, what year did you quit smoking? 2013(within 15 yrs)   To calculate your smoking history, I need an accurate estimate of how many packs of cigarettes you smoked per day and for how many years. (Not just the number of PPD you are now smoking)   Years smoking 31 x Packs per day 3/4 = Pack years 23.25   (at least 20 pack yrs)   (Make sure they understand that we need to know how much they have smoked in the past, not just the number of PPD they are smoking now)  Do you have a personal history of cancer?  Yes - (type and when diagnosed - 5 yrs cancer free) Breast - dx in 1999    Do you have a family history of cancer? Yes  (cancer type and and relative) father - stomach   Are you coughing up blood?  No  Have you had unexplained weight loss of 15 lbs or more in the last 6 months? No  It looks like you meet all criteria.  When would be a good time for us  to schedule you for this screening?   Additional  information: N/A

## 2023-10-11 DIAGNOSIS — K635 Polyp of colon: Secondary | ICD-10-CM | POA: Diagnosis not present

## 2023-10-11 DIAGNOSIS — D128 Benign neoplasm of rectum: Secondary | ICD-10-CM | POA: Diagnosis not present

## 2023-10-11 DIAGNOSIS — D125 Benign neoplasm of sigmoid colon: Secondary | ICD-10-CM | POA: Diagnosis not present

## 2023-10-12 DIAGNOSIS — R000 Tachycardia, unspecified: Secondary | ICD-10-CM | POA: Diagnosis not present

## 2023-10-12 DIAGNOSIS — R55 Syncope and collapse: Secondary | ICD-10-CM | POA: Diagnosis not present

## 2023-10-12 DIAGNOSIS — I469 Cardiac arrest, cause unspecified: Secondary | ICD-10-CM | POA: Diagnosis not present

## 2023-10-12 DIAGNOSIS — R197 Diarrhea, unspecified: Secondary | ICD-10-CM | POA: Diagnosis not present

## 2023-10-12 DIAGNOSIS — R58 Hemorrhage, not elsewhere classified: Secondary | ICD-10-CM | POA: Diagnosis not present

## 2023-10-18 DIAGNOSIS — J439 Emphysema, unspecified: Secondary | ICD-10-CM | POA: Diagnosis not present

## 2023-10-20 ENCOUNTER — Encounter: Payer: Self-pay | Admitting: Adult Health

## 2023-10-20 ENCOUNTER — Ambulatory Visit: Admitting: Adult Health

## 2023-10-20 DIAGNOSIS — Z87891 Personal history of nicotine dependence: Secondary | ICD-10-CM | POA: Diagnosis not present

## 2023-10-20 NOTE — Patient Instructions (Signed)

## 2023-10-20 NOTE — Progress Notes (Signed)
  Virtual Visit via Telephone Note  I connected with Candace Howe , 10/20/23 9:57 AM by a telemedicine application and verified that I am speaking with the correct person using two identifiers.  Location: Patient: home Provider: home   I discussed the limitations of evaluation and management by telemedicine and the availability of in person appointments. The patient expressed understanding and agreed to proceed.   Shared Decision Making Visit Lung Cancer Screening Program [phone])   Eligibility: 73 y.o. Pack Years Smoking History Calculation = 24 pack years (# packs/per year x # years smoked) Recent History of coughing up blood  no Unexplained weight loss? no ( >Than 15 pounds within the last 6 months ) Prior History Lung / other cancer no (Diagnosis within the last 5 years already requiring surveillance chest CT Scans). Smoking Status Former Smoker Former Smokers: Years since quit: 12 years  Quit Date: 2013  Visit Components: Discussion included one or more decision making aids. YES Discussion included risk/benefits of screening. YES Discussion included potential follow up diagnostic testing for abnormal scans. YES Discussion included meaning and risk of over diagnosis. YES Discussion included meaning and risk of False Positives. YES Discussion included meaning of total radiation exposure. YES  Counseling Included: Importance of adherence to annual lung cancer LDCT screening. YES Impact of comorbidities on ability to participate in the program. YES Ability and willingness to under diagnostic treatment. YES  Smoking Cessation Counseling: Former Smokers:  Discussed the importance of maintaining cigarette abstinence. yes Diagnosis Code: Personal History of Nicotine Dependence. S12.108 Information about tobacco cessation classes and interventions provided to patient. Yes Patient provided with ticket for LDCT Scan. yes Written Order for Lung Cancer Screening  with LDCT placed in Epic. Yes (CT Chest Lung Cancer Screening Low Dose W/O CM) [id number]  Z12.2-Screening of respiratory organs Z87.891-Personal history of nicotine dependence   Candace Howe 10/20/23

## 2023-10-21 ENCOUNTER — Ambulatory Visit
Admission: RE | Admit: 2023-10-21 | Discharge: 2023-10-21 | Disposition: A | Discharge status: Home / Self Care | Source: Ambulatory Visit | Attending: Acute Care | Admitting: Acute Care

## 2023-10-21 DIAGNOSIS — Z122 Encounter for screening for malignant neoplasm of respiratory organs: Secondary | ICD-10-CM | POA: Diagnosis not present

## 2023-10-21 DIAGNOSIS — Z87891 Personal history of nicotine dependence: Secondary | ICD-10-CM

## 2023-10-22 ENCOUNTER — Ambulatory Visit (HOSPITAL_COMMUNITY)
Admission: RE | Admit: 2023-10-22 | Discharge: 2023-10-22 | Disposition: A | Discharge status: Home / Self Care | Source: Ambulatory Visit | Attending: Internal Medicine | Admitting: Internal Medicine

## 2023-10-22 DIAGNOSIS — J9610 Chronic respiratory failure, unspecified whether with hypoxia or hypercapnia: Secondary | ICD-10-CM | POA: Diagnosis not present

## 2023-10-22 LAB — BLOOD GAS, ARTERIAL
Acid-Base Excess: 5.5 mmol/L — ABNORMAL HIGH (ref 0.0–2.0)
Bicarbonate: 31.6 mmol/L — ABNORMAL HIGH (ref 20.0–28.0)
Drawn by: 21179
O2 Saturation: 90.8 %
Patient temperature: 37
pCO2 arterial: 51 mmHg — ABNORMAL HIGH (ref 32–48)
pH, Arterial: 7.4 (ref 7.35–7.45)
pO2, Arterial: 56 mmHg — ABNORMAL LOW (ref 83–108)

## 2023-10-22 NOTE — Progress Notes (Signed)
 Respiratory Care Note: Patient in today for RA ABG.  Text done and results in computer

## 2023-10-25 ENCOUNTER — Other Ambulatory Visit: Payer: Self-pay

## 2023-10-25 DIAGNOSIS — Z122 Encounter for screening for malignant neoplasm of respiratory organs: Secondary | ICD-10-CM

## 2023-10-25 DIAGNOSIS — Z87891 Personal history of nicotine dependence: Secondary | ICD-10-CM

## 2023-11-10 ENCOUNTER — Ambulatory Visit: Payer: Self-pay | Admitting: Internal Medicine

## 2023-11-18 ENCOUNTER — Ambulatory Visit (INDEPENDENT_AMBULATORY_CARE_PROVIDER_SITE_OTHER)

## 2023-11-18 ENCOUNTER — Encounter: Payer: Self-pay | Admitting: Internal Medicine

## 2023-11-18 ENCOUNTER — Ambulatory Visit: Admitting: Internal Medicine

## 2023-11-18 VITALS — BP 154/100 | HR 85 | Temp 98.2°F | Ht 64.0 in | Wt 109.4 lb

## 2023-11-18 DIAGNOSIS — J4489 Other specified chronic obstructive pulmonary disease: Secondary | ICD-10-CM

## 2023-11-18 DIAGNOSIS — J9611 Chronic respiratory failure with hypoxia: Secondary | ICD-10-CM | POA: Diagnosis not present

## 2023-11-18 DIAGNOSIS — Z87891 Personal history of nicotine dependence: Secondary | ICD-10-CM | POA: Diagnosis not present

## 2023-11-18 DIAGNOSIS — J439 Emphysema, unspecified: Secondary | ICD-10-CM | POA: Diagnosis not present

## 2023-11-18 DIAGNOSIS — Z122 Encounter for screening for malignant neoplasm of respiratory organs: Secondary | ICD-10-CM

## 2023-11-18 LAB — PULMONARY FUNCTION TEST
DL/VA % pred: 58 %
DL/VA: 2.4 ml/min/mmHg/L
DLCO unc % pred: 37 %
DLCO unc: 7.19 ml/min/mmHg
FEF 25-75 Post: 0.46 L/s
FEF 25-75 Pre: 0.29 L/s
FEF2575-%Change-Post: 58 %
FEF2575-%Pred-Post: 24 %
FEF2575-%Pred-Pre: 15 %
FEV1-%Change-Post: 25 %
FEV1-%Pred-Post: 41 %
FEV1-%Pred-Pre: 32 %
FEV1-Post: 0.92 L
FEV1-Pre: 0.74 L
FEV1FVC-%Change-Post: 11 %
FEV1FVC-%Pred-Pre: 58 %
FEV6-%Change-Post: 15 %
FEV6-%Pred-Post: 63 %
FEV6-%Pred-Pre: 55 %
FEV6-Post: 1.79 L
FEV6-Pre: 1.56 L
FEV6FVC-%Change-Post: 2 %
FEV6FVC-%Pred-Post: 100 %
FEV6FVC-%Pred-Pre: 98 %
FVC-%Change-Post: 12 %
FVC-%Pred-Post: 62 %
FVC-%Pred-Pre: 55 %
FVC-Post: 1.86 L
FVC-Pre: 1.65 L
Post FEV1/FVC ratio: 50 %
Post FEV6/FVC ratio: 96 %
Pre FEV1/FVC ratio: 45 %
Pre FEV6/FVC Ratio: 94 %
RV % pred: 176 %
RV: 3.91 L
TLC % pred: 116 %
TLC: 5.88 L

## 2023-11-18 NOTE — Progress Notes (Signed)
 Laloni Rowton Wessell    [id number]    11-Jul-1950  Primary Care Physician:Hamrick, Charlene CROME, MD Date of Appointment: 11/18/2023 Established Patient Visit  Chief complaint:   Chief Complaint  Patient presents with   COPD    Follow up pft     HPI: Faydra Korman is a 73 y.o. woman with COPD.   Interval Updates: Here for follow up after PFTs.   Current therapy - albuterol nebs. She has stopped trelegy She is getting nebulized forms of (I suspect) LAMA, LABA, ICS and has albuterol as needed. She isn't sure where they are coming from. She doesn't know the names of the medicines either.   However does feel like they are helping.   Had LDCT for lung cancer screening - no evidence of lung cancer  She still works as a conservation officer, nature three days a week   Past Medical History:  Diagnosis Date   Anxiety    takes Ativan prn   Basal cell carcinoma    Breast cancer (HCC) 02/1997   lumpectomy w/ alnd, chemo, rt, and tamoxifen   Depression    takes Celexa daily   Insomnia    takes Elavil nightly   Neuromuscular disorder (HCC)    nerve damage under left arm   Personal history of chemotherapy 1999   Left Breast Cancer   Personal history of radiation therapy 1999   Left Breast Cancer   Squamous cell skin cancer     Past Surgical History:  Procedure Laterality Date   ABDOMINAL HYSTERECTOMY  1986   BREAST BIOPSY Right 05/02/2012   Procedure: RIGHT BREAST WIRE GUIDED BIOPSY;  Surgeon: Donnice Bury, MD;  Location: [facility] OR;  Service: General;  Laterality: Right;   BREAST LUMPECTOMY Left 1999    Family History  Problem Relation Age of Onset   Cancer Father        lung Ca at age 76 yrs   Breast cancer Neg Hx     Social History   Occupational History   Occupation: engineer, production    Comment: Lawyer  Tobacco Use   Smoking status: Former    Current packs/day: 0.00    Average packs/day: 0.8 packs/day for 34.0 years (25.5 ttl pk-yrs)    Types: Cigarettes     Start date: 73    Quit date: 2014    Years since quitting: 11.8   Smokeless tobacco: Never  Vaping Use   Vaping status: Never Used  Substance and Sexual Activity   Alcohol use: No   Drug use: No   Sexual activity: Never    Birth control/protection: Surgical     Physical Exam: Blood pressure (!) 154/100, pulse 85, temperature 98.2 F (36.8 C), temperature source Oral, height 5' 4 (1.626 m), weight 109 lb 6.4 oz (49.6 kg), SpO2 96%.  Gen:      No acute distress, thin elderly woman, frail ENT:  nasal cannula, no nasal polyps, mucus membranes moist Lungs:   Diminished, No increased respiratory effort, symmetric chest wall excursion, clear to auscultation bilaterally, no wheezes or crackles CV:         Regular rate and rhythm; no murmurs, rubs, or gallops.  No pedal edema   Data Reviewed: Imaging: I have personally reviewed the CT Chest October 2025 - shows moderate emphysema, no evidence of lung cancer  PFTs:     Latest Ref Rng & Units 11/18/2023   12:44 PM  PFT Results  FVC-Pre L 1.65  P  FVC-Predicted Pre % 55  P  FVC-Post L 1.86  P  FVC-Predicted Post % 62  P  Pre FEV1/FVC % % 45  P  Post FEV1/FCV % % 50  P  FEV1-Pre L 0.74  P  FEV1-Predicted Pre % 32  P  FEV1-Post L 0.92  P  DLCO uncorrected ml/min/mmHg 7.19  P  DLCO UNC% % 37  P  DLVA Predicted % 58  P  TLC L 5.88  P  TLC % Predicted % 116  P  RV % Predicted % 176  P    P Preliminary result   I have personally reviewed the patient's PFTs and severe copd with emphysema. Hyperinflation and reduced diffusion capacity  Labs: Lab Results  Component Value Date   WBC 4.9 03/18/2015   HGB 15.7 03/18/2015   HCT 48.2 (H) 03/18/2015   MCV 93.3 03/18/2015   PLT 223 03/18/2015    Immunization status: Immunization History  Administered Date(s) Administered   Fluad Trivalent(High Dose 65+) 08/17/2023     Assessment:  Severe COPD FEV1 32% of predicted Chronic respiratory failure with hypoxemia History  of tobacco use disorder Lung cancer screening  Plan/Recommendations:  I am glad you are wearing your oxygen .  I want you to turn your oxygen  up to 4 L at nighttime.  Continue 2 to 2-1/2 L during the day to keep your oxygen  saturations over 88%.  Unfortunately based on the current ABG it does not seem like I can get you qualified for a BiPAP machine at nighttime.  This might be something we can revisit in the future.  Continue the nebulized medications.  Please please bring these in next time so that we can see what you are taking.  Continue the albuterol nebulizer treatments up to 4 times a day as needed for chest tightness, shortness of breath, wheezing, cough.  I am referring you to pulmonary rehab which is a structured exercise program for patients chronic lung disease.  I think you will benefit and it will help improve your symptoms and quality of life.   I spent 30 minutes on 11/18/2023 in care of this patient including face to face time and non-face to face time spent charting, review of outside records overnight oximetry, and coordination of care including referrals, tests.   Return to Care: Return in about 3 months (around 02/18/2024) for Dr. Pleas.   Verdon Gore, MD Pulmonary and Critical Care Medicine [facility] [phone]

## 2023-11-18 NOTE — Progress Notes (Signed)
 Full pft performed today

## 2023-11-18 NOTE — Patient Instructions (Signed)
 Full pft performed today

## 2023-11-18 NOTE — Patient Instructions (Addendum)
 It was a pleasure to see you today!  Please schedule follow up with Dr. Pleas in 3 months. Please call sooner [phone] if issues or concerns arise. You can also send us  a message through MyChart, but but aware that this is not to be used for urgent issues and it may take up to 5-7 days to receive a reply. Please be aware that you will likely be able to view your results before I have a chance to respond to them. Please give us  5 business days to respond to any non-urgent results.    Before your next visit I would like you to have: Referral to pulmonary rehab.  I am glad you are wearing your oxygen .  I want you to turn your oxygen  up to 4 L at nighttime.  Continue 2 to 2-1/2 L during the day to keep your oxygen  saturations over 88%.  Unfortunately based on the current ABG it does not seem like I can get you qualified for a BiPAP machine at nighttime.  This might be something we can revisit in the future.  Continue the nebulized medications.  Please please bring these in next time so that we can see what you are taking.  Continue the albuterol nebulizer treatments up to 4 times a day as needed for chest tightness, shortness of breath, wheezing, cough.  I am referring you to pulmonary rehab which is a structured exercise program for patients chronic lung disease.  I think you will benefit and it will help improve your symptoms and quality of life.

## 2023-11-24 ENCOUNTER — Telehealth: Payer: Self-pay | Admitting: Internal Medicine

## 2023-11-24 DIAGNOSIS — J439 Emphysema, unspecified: Secondary | ICD-10-CM | POA: Diagnosis not present

## 2023-11-24 NOTE — Telephone Encounter (Signed)
 Copied from CRM #[id number]. Topic: Clinical - Medication Refill >> Nov 24, 2023  3:35 PM Abigail D wrote: Medication: Arformoterol  tartrate, Ipratropium bromide , Albuterol  sulfate  Not showing on med list but patient insistent that they are prescribed by Dr. Meade   Has the patient contacted their pharmacy? Yes (Agent: If no, request that the patient contact the pharmacy for the refill. If patient does not wish to contact the pharmacy document the reason why and proceed with request.) (Agent: If yes, when and what did the pharmacy advise?)  This is the patient's preferred pharmacy:  [facility] [address], [place], [place] [address] Phone: [phone]   Is this the correct pharmacy for this prescription? Yes If no, delete pharmacy and type the correct one.   Has the prescription been filled recently? No  Is the patient out of the medication? No  Has the patient been seen for an appointment in the last year OR does the patient have an upcoming appointment? Yes  Can we respond through MyChart? Yes  Agent: Please be advised that Rx refills may take up to 3 business days. We ask that you follow-up with your pharmacy.

## 2023-11-25 NOTE — Telephone Encounter (Signed)
 Dr. Meade, Patient is requesting nebulizer medications, Arformoterol tartrate and Ipratropium bromide.  She states they were prescribed by you.  According to  your notes you prescribed Trelegy and albuterol nebs to continue.  I do not see any record in your notes that she has been prescribed these medications.  Please advise.  Thank you.

## 2023-11-25 NOTE — Telephone Encounter (Signed)
 Copied from CRM #[id number]. Topic: Clinical - Medication Question >> Nov 24, 2023  3:34 PM Lavanda D wrote: Reason for CRM: Patient needs her budesonide prescription changed as it has been causing weight gain, she is requesting something without steroids in it. She also noted that she has the following: Arformoterol tartrate, Budesonide, Ipratropium bromide, Albuterol sulfate. She said these were prescribed also by Dr. Meade but do not show up in the chart.   I called and spoke with patient, she states she has been on the nebulizer medications for about a month (budesonide, arformoterol and Ipratropium), she was not aware until recently that any of them had a seroid in them.  She has gained about 10 lbs in the last month and does not want to have a weight problem.  She says she is little now, but does not want to continue to gain weight on the medication.  I advised her that I would get a message to Dr. Meade, however she is out of the office and she would address this when she returns on Wednesday 11/26.  She verbalized understanding.  Dr. Meade, Patient states you ordered the nebulizer meds about a month ago and she has been getting them through a mail order pharmacy.  She is concerned about her 10 lb weight gain since being on the medication and would like the medication changed to something without a steroid in it.  Please advise.  Thank you.

## 2023-12-01 ENCOUNTER — Telehealth (HOSPITAL_COMMUNITY): Payer: Self-pay

## 2023-12-01 NOTE — Telephone Encounter (Signed)
 Pt insurance is active and benefits verified through [facility]. Co-pay $15, DED $0/$0 met, out of pocket $4,000/$786.57 met, co-insurance 0%. No pre-authorization required. 12/01/2023 @ 9:35am, spoke with Jeffery BIRCH., REF# [id number].

## 2023-12-01 NOTE — Telephone Encounter (Signed)
 She did not know the names of her medications when she came to see me. The budesonide  is the one with the steroid. I think it's very unlikely that it's causing weight gain - this is much more common with oral or IV steroids which she received while hospitalized. However if she wants to stop it I would recommend staying  Would continue the afomoterol, ipratropium and albuterol  for now.

## 2023-12-01 NOTE — Telephone Encounter (Signed)
 Called pt to attempt to schedule for Pulm Rehab. Voicemail full and unable to leave message, will try again

## 2023-12-07 DIAGNOSIS — R300 Dysuria: Secondary | ICD-10-CM | POA: Diagnosis not present

## 2023-12-07 DIAGNOSIS — N132 Hydronephrosis with renal and ureteral calculous obstruction: Secondary | ICD-10-CM | POA: Diagnosis not present

## 2023-12-07 DIAGNOSIS — R319 Hematuria, unspecified: Secondary | ICD-10-CM | POA: Diagnosis not present

## 2023-12-08 NOTE — Telephone Encounter (Signed)
 I called and spoke with patient, provided information/recommendations per Dr. Meade.  She stated for now she will continue the budesonide  at least once a day because it is the one that opens her up.  Nothing further needed.

## 2023-12-15 ENCOUNTER — Telehealth (HOSPITAL_COMMUNITY): Payer: Self-pay

## 2023-12-15 NOTE — Telephone Encounter (Signed)
 F/u call regarding pulmonary rehab. Patient states she works Tuesdays and cannot attend both days per week. Informed her if anything changes to call us  back as she has a year to use her referral.  Closing referral.

## 2024-02-25 ENCOUNTER — Ambulatory Visit
